# Patient Record
Sex: Female | Born: 1935 | Race: White | Hispanic: No | Marital: Married | Smoking: Former smoker
Health system: Southern US, Community
[De-identification: ages and names within clinical notes are randomized; demographics above are authoritative.]

## PROBLEM LIST (undated history)

## (undated) DIAGNOSIS — E7800 Pure hypercholesterolemia, unspecified: Secondary | ICD-10-CM

## (undated) DIAGNOSIS — K219 Gastro-esophageal reflux disease without esophagitis: Secondary | ICD-10-CM

## (undated) DIAGNOSIS — M1990 Unspecified osteoarthritis, unspecified site: Secondary | ICD-10-CM

## (undated) DIAGNOSIS — R7302 Impaired glucose tolerance (oral): Secondary | ICD-10-CM

## (undated) DIAGNOSIS — E039 Hypothyroidism, unspecified: Secondary | ICD-10-CM

## (undated) DIAGNOSIS — I219 Acute myocardial infarction, unspecified: Secondary | ICD-10-CM

## (undated) DIAGNOSIS — I10 Essential (primary) hypertension: Secondary | ICD-10-CM

## (undated) HISTORY — PX: OTHER SURGICAL HISTORY: SHX169

## (undated) HISTORY — DX: Unspecified osteoarthritis, unspecified site: M19.90

## (undated) HISTORY — DX: Gastro-esophageal reflux disease without esophagitis: K21.9

## (undated) HISTORY — PX: UMBILICAL HERNIA REPAIR: SHX196

## (undated) HISTORY — PX: FOOT SURGERY: SHX648

## (undated) HISTORY — DX: Pure hypercholesterolemia, unspecified: E78.00

## (undated) HISTORY — DX: Essential (primary) hypertension: I10

## (undated) HISTORY — DX: Impaired glucose tolerance (oral): R73.02

## (undated) HISTORY — PX: CHOLECYSTECTOMY: SHX55

---

## 1997-10-06 ENCOUNTER — Ambulatory Visit: Admission: RE | Admit: 1997-10-06 | Discharge: 1997-10-06 | Payer: Self-pay | Admitting: *Deleted

## 1998-11-24 ENCOUNTER — Encounter: Admission: RE | Admit: 1998-11-24 | Discharge: 1998-11-24 | Payer: Self-pay | Admitting: Internal Medicine

## 1998-11-24 ENCOUNTER — Other Ambulatory Visit: Admission: RE | Admit: 1998-11-24 | Discharge: 1998-11-24 | Payer: Self-pay | Admitting: *Deleted

## 1998-12-08 ENCOUNTER — Encounter: Admission: RE | Admit: 1998-12-08 | Discharge: 1998-12-08 | Payer: Self-pay | Admitting: Internal Medicine

## 1999-05-06 ENCOUNTER — Encounter: Admission: RE | Admit: 1999-05-06 | Discharge: 1999-05-06 | Payer: Self-pay | Admitting: Internal Medicine

## 1999-05-26 ENCOUNTER — Ambulatory Visit (HOSPITAL_COMMUNITY): Admission: RE | Admit: 1999-05-26 | Discharge: 1999-05-26 | Payer: Self-pay | Admitting: *Deleted

## 1999-06-01 ENCOUNTER — Encounter: Admission: RE | Admit: 1999-06-01 | Discharge: 1999-06-01 | Payer: Self-pay | Admitting: Internal Medicine

## 1999-06-09 ENCOUNTER — Encounter: Payer: Self-pay | Admitting: *Deleted

## 1999-06-09 ENCOUNTER — Ambulatory Visit (HOSPITAL_COMMUNITY): Admission: RE | Admit: 1999-06-09 | Discharge: 1999-06-09 | Payer: Self-pay | Admitting: *Deleted

## 1999-06-28 ENCOUNTER — Encounter: Payer: Self-pay | Admitting: Emergency Medicine

## 1999-06-28 ENCOUNTER — Emergency Department (HOSPITAL_COMMUNITY): Admission: EM | Admit: 1999-06-28 | Discharge: 1999-06-28 | Payer: Self-pay | Admitting: Emergency Medicine

## 1999-06-29 ENCOUNTER — Encounter: Payer: Self-pay | Admitting: Emergency Medicine

## 1999-07-13 ENCOUNTER — Encounter: Admission: RE | Admit: 1999-07-13 | Discharge: 1999-07-13 | Payer: Self-pay | Admitting: Hematology and Oncology

## 1999-08-05 ENCOUNTER — Encounter: Admission: RE | Admit: 1999-08-05 | Discharge: 1999-08-05 | Payer: Self-pay | Admitting: Internal Medicine

## 2000-07-10 ENCOUNTER — Encounter (INDEPENDENT_AMBULATORY_CARE_PROVIDER_SITE_OTHER): Payer: Self-pay | Admitting: *Deleted

## 2000-07-20 ENCOUNTER — Ambulatory Visit (HOSPITAL_COMMUNITY): Admission: RE | Admit: 2000-07-20 | Discharge: 2000-07-20 | Payer: Self-pay | Admitting: Cardiology

## 2000-07-20 ENCOUNTER — Encounter: Payer: Self-pay | Admitting: Cardiology

## 2000-11-26 ENCOUNTER — Emergency Department (HOSPITAL_COMMUNITY): Admission: EM | Admit: 2000-11-26 | Discharge: 2000-11-26 | Payer: Self-pay | Admitting: *Deleted

## 2000-12-18 ENCOUNTER — Encounter: Payer: Self-pay | Admitting: Internal Medicine

## 2000-12-25 ENCOUNTER — Encounter: Admission: RE | Admit: 2000-12-25 | Discharge: 2000-12-25 | Payer: Self-pay | Admitting: Internal Medicine

## 2001-01-01 ENCOUNTER — Ambulatory Visit (HOSPITAL_BASED_OUTPATIENT_CLINIC_OR_DEPARTMENT_OTHER): Admission: RE | Admit: 2001-01-01 | Discharge: 2001-01-01 | Payer: Self-pay | Admitting: Orthopedic Surgery

## 2001-01-01 ENCOUNTER — Encounter (INDEPENDENT_AMBULATORY_CARE_PROVIDER_SITE_OTHER): Payer: Self-pay | Admitting: *Deleted

## 2001-01-25 ENCOUNTER — Encounter: Admission: RE | Admit: 2001-01-25 | Discharge: 2001-01-25 | Payer: Self-pay | Admitting: Internal Medicine

## 2002-04-22 ENCOUNTER — Encounter: Admission: RE | Admit: 2002-04-22 | Discharge: 2002-04-22 | Payer: Self-pay | Admitting: Internal Medicine

## 2002-05-06 ENCOUNTER — Encounter: Admission: RE | Admit: 2002-05-06 | Discharge: 2002-05-06 | Payer: Self-pay | Admitting: Internal Medicine

## 2002-06-02 ENCOUNTER — Encounter: Payer: Self-pay | Admitting: Cardiology

## 2002-06-02 ENCOUNTER — Ambulatory Visit (HOSPITAL_COMMUNITY): Admission: RE | Admit: 2002-06-02 | Discharge: 2002-06-02 | Payer: Self-pay | Admitting: Cardiology

## 2003-07-17 ENCOUNTER — Ambulatory Visit (HOSPITAL_COMMUNITY): Admission: RE | Admit: 2003-07-17 | Discharge: 2003-07-17 | Payer: Self-pay | Admitting: Cardiology

## 2005-02-07 ENCOUNTER — Encounter: Admission: RE | Admit: 2005-02-07 | Discharge: 2005-02-07 | Payer: Self-pay | Admitting: Cardiology

## 2005-02-07 ENCOUNTER — Encounter (INDEPENDENT_AMBULATORY_CARE_PROVIDER_SITE_OTHER): Payer: Self-pay | Admitting: *Deleted

## 2005-03-13 ENCOUNTER — Encounter: Admission: RE | Admit: 2005-03-13 | Discharge: 2005-03-13 | Payer: Self-pay | Admitting: General Surgery

## 2005-03-16 ENCOUNTER — Ambulatory Visit (HOSPITAL_COMMUNITY): Admission: RE | Admit: 2005-03-16 | Discharge: 2005-03-16 | Payer: Self-pay | Admitting: General Surgery

## 2005-03-16 ENCOUNTER — Encounter (INDEPENDENT_AMBULATORY_CARE_PROVIDER_SITE_OTHER): Payer: Self-pay | Admitting: Specialist

## 2005-03-16 ENCOUNTER — Ambulatory Visit (HOSPITAL_BASED_OUTPATIENT_CLINIC_OR_DEPARTMENT_OTHER): Admission: RE | Admit: 2005-03-16 | Discharge: 2005-03-16 | Payer: Self-pay | Admitting: General Surgery

## 2005-03-30 ENCOUNTER — Encounter: Payer: Self-pay | Admitting: Internal Medicine

## 2006-03-27 ENCOUNTER — Encounter: Admission: RE | Admit: 2006-03-27 | Discharge: 2006-03-27 | Payer: Self-pay | Admitting: Cardiology

## 2006-11-15 ENCOUNTER — Encounter (INDEPENDENT_AMBULATORY_CARE_PROVIDER_SITE_OTHER): Payer: Self-pay | Admitting: *Deleted

## 2006-11-16 DIAGNOSIS — R1032 Left lower quadrant pain: Secondary | ICD-10-CM | POA: Insufficient documentation

## 2006-11-30 ENCOUNTER — Encounter: Payer: Self-pay | Admitting: Internal Medicine

## 2006-11-30 ENCOUNTER — Ambulatory Visit: Payer: Self-pay | Admitting: Internal Medicine

## 2006-11-30 ENCOUNTER — Encounter (INDEPENDENT_AMBULATORY_CARE_PROVIDER_SITE_OTHER): Payer: Self-pay | Admitting: *Deleted

## 2006-11-30 LAB — CONVERTED CEMR LAB
BUN: 6 mg/dL (ref 6–23)
CO2: 29 meq/L (ref 19–32)
Calcium: 9.4 mg/dL (ref 8.4–10.5)
Chloride: 103 meq/L (ref 96–112)
Creatinine, Ser: 0.94 mg/dL (ref 0.40–1.20)
HCT: 33.6 % — ABNORMAL LOW (ref 36.0–46.0)
Hemoglobin: 11.3 g/dL — ABNORMAL LOW (ref 12.0–15.0)
Lipase: 22 units/L (ref 11–59)
MCV: 91.2 fL (ref 78.0–100.0)
Nitrite: NEGATIVE
RDW: 14.6 % — ABNORMAL HIGH (ref 11.5–14.0)
Specific Gravity, Urine: 1.013 (ref 1.005–1.03)
Total Bilirubin: 0.6 mg/dL (ref 0.3–1.2)
Urobilinogen, UA: 0.2 (ref 0.0–1.0)
WBC: 6.5 10*3/uL (ref 4.0–10.5)
pH: 7 (ref 5.0–8.0)

## 2006-12-13 ENCOUNTER — Ambulatory Visit: Payer: Self-pay | Admitting: Internal Medicine

## 2006-12-13 ENCOUNTER — Encounter (INDEPENDENT_AMBULATORY_CARE_PROVIDER_SITE_OTHER): Payer: Self-pay | Admitting: Internal Medicine

## 2006-12-13 ENCOUNTER — Other Ambulatory Visit: Admission: RE | Admit: 2006-12-13 | Discharge: 2006-12-13 | Payer: Self-pay | Admitting: Internal Medicine

## 2006-12-13 LAB — CONVERTED CEMR LAB: Candida species: NEGATIVE

## 2007-01-14 ENCOUNTER — Ambulatory Visit: Payer: Self-pay | Admitting: *Deleted

## 2007-01-14 ENCOUNTER — Encounter (INDEPENDENT_AMBULATORY_CARE_PROVIDER_SITE_OTHER): Payer: Self-pay | Admitting: *Deleted

## 2007-01-14 ENCOUNTER — Ambulatory Visit (HOSPITAL_COMMUNITY): Admission: RE | Admit: 2007-01-14 | Discharge: 2007-01-14 | Payer: Self-pay | Admitting: *Deleted

## 2007-01-14 DIAGNOSIS — M8530 Osteitis condensans, unspecified site: Secondary | ICD-10-CM | POA: Insufficient documentation

## 2007-01-14 DIAGNOSIS — E785 Hyperlipidemia, unspecified: Secondary | ICD-10-CM

## 2007-01-14 DIAGNOSIS — N952 Postmenopausal atrophic vaginitis: Secondary | ICD-10-CM

## 2007-01-14 DIAGNOSIS — I2510 Atherosclerotic heart disease of native coronary artery without angina pectoris: Secondary | ICD-10-CM

## 2007-01-14 DIAGNOSIS — I10 Essential (primary) hypertension: Secondary | ICD-10-CM | POA: Insufficient documentation

## 2007-01-14 DIAGNOSIS — H903 Sensorineural hearing loss, bilateral: Secondary | ICD-10-CM | POA: Insufficient documentation

## 2007-01-16 ENCOUNTER — Encounter (INDEPENDENT_AMBULATORY_CARE_PROVIDER_SITE_OTHER): Payer: Self-pay | Admitting: *Deleted

## 2007-01-16 LAB — CONVERTED CEMR LAB
Cholesterol: 161 mg/dL (ref 0–200)
HDL: 46 mg/dL (ref >39)
Total CHOL/HDL Ratio: 3.5
Triglycerides: 148 mg/dL (ref <150)
VLDL: 30 mg/dL (ref 0–40)

## 2007-01-22 ENCOUNTER — Encounter: Admission: RE | Admit: 2007-01-22 | Discharge: 2007-01-22 | Payer: Self-pay | Admitting: Sports Medicine

## 2007-02-19 ENCOUNTER — Ambulatory Visit: Payer: Self-pay | Admitting: Internal Medicine

## 2007-02-19 DIAGNOSIS — L089 Local infection of the skin and subcutaneous tissue, unspecified: Secondary | ICD-10-CM | POA: Insufficient documentation

## 2007-03-07 ENCOUNTER — Telehealth: Payer: Self-pay

## 2007-04-17 ENCOUNTER — Ambulatory Visit: Payer: Self-pay | Admitting: Infectious Diseases

## 2007-04-17 ENCOUNTER — Encounter (INDEPENDENT_AMBULATORY_CARE_PROVIDER_SITE_OTHER): Payer: Self-pay | Admitting: *Deleted

## 2007-04-17 DIAGNOSIS — R61 Generalized hyperhidrosis: Secondary | ICD-10-CM | POA: Insufficient documentation

## 2007-04-18 LAB — CONVERTED CEMR LAB
BUN: 15 mg/dL (ref 6–23)
Calcium: 9.5 mg/dL (ref 8.4–10.5)
Chloride: 99 meq/L (ref 96–112)
Creatinine, Ser: 0.99 mg/dL (ref 0.40–1.20)

## 2007-04-24 ENCOUNTER — Encounter (INDEPENDENT_AMBULATORY_CARE_PROVIDER_SITE_OTHER): Payer: Self-pay | Admitting: *Deleted

## 2007-10-26 IMAGING — CR DG HIP W/ PELVIS BILAT
5 series · 5 of 5 positions shown · non-contrast
Comparison: None.

CLINICAL DATA: BILATERAL HIPS WITH PELVIS - 5 VIEWS:

[t pelvis a.p.]
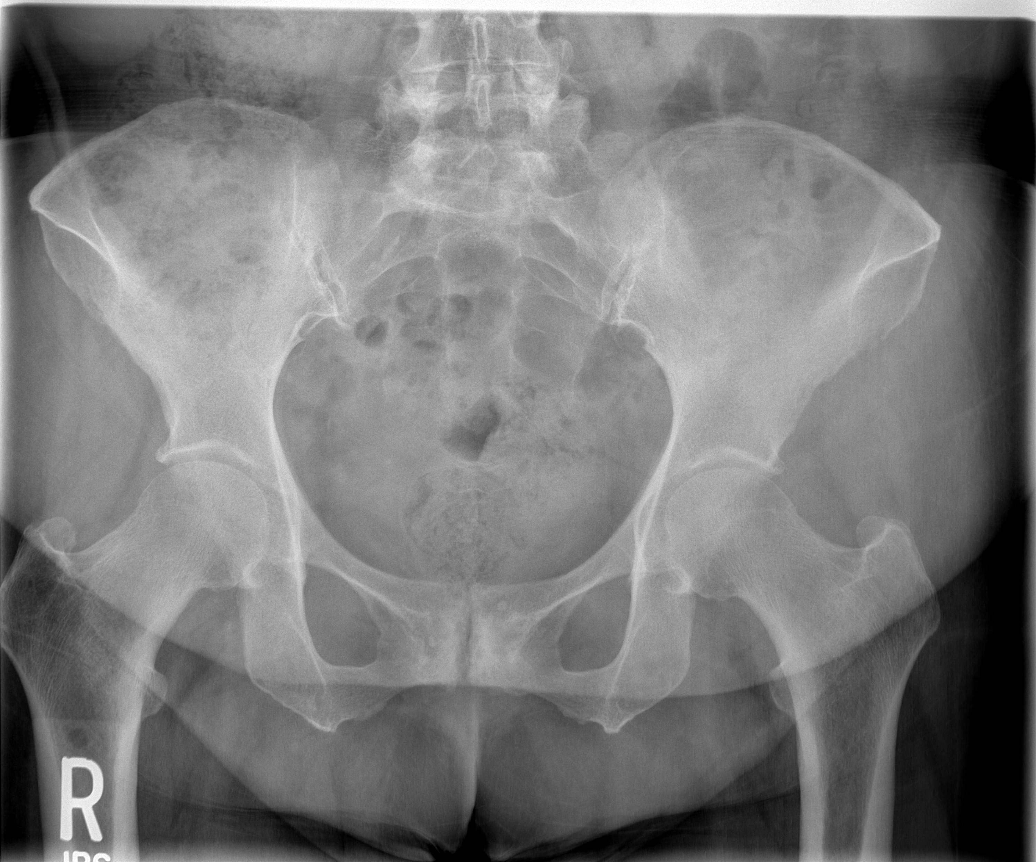

[t hip ap left]
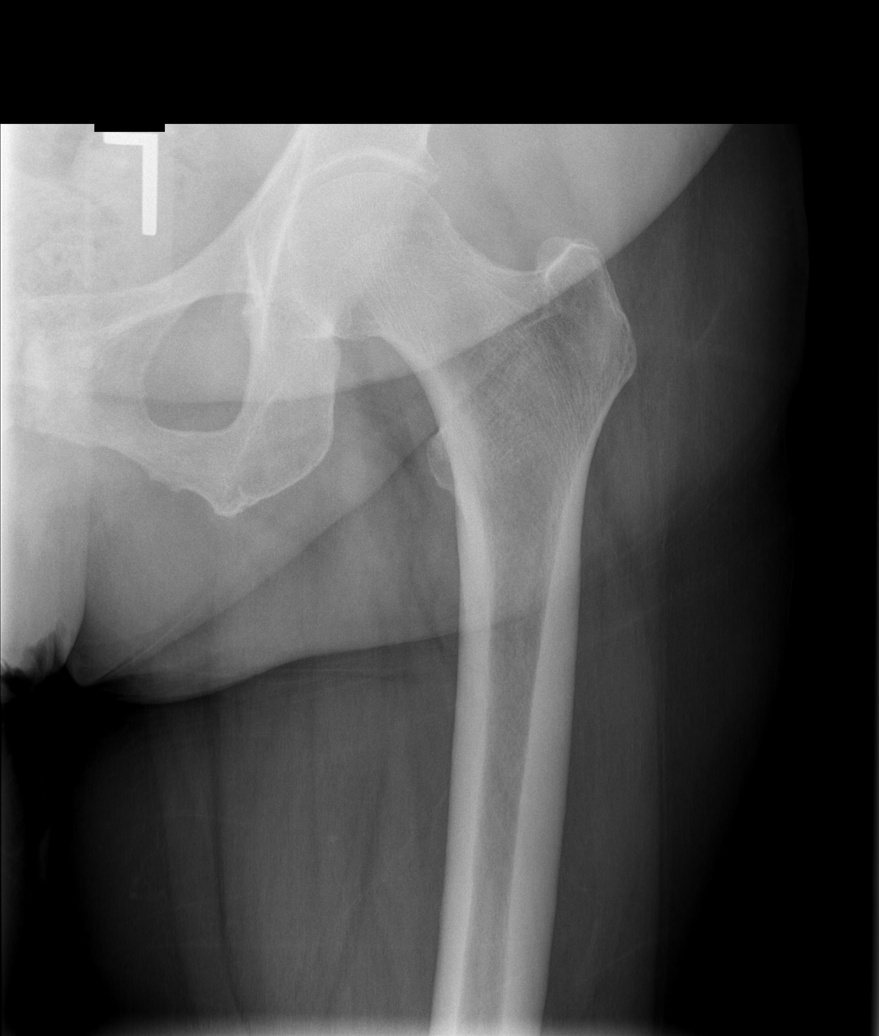

[t hip frog leg left]
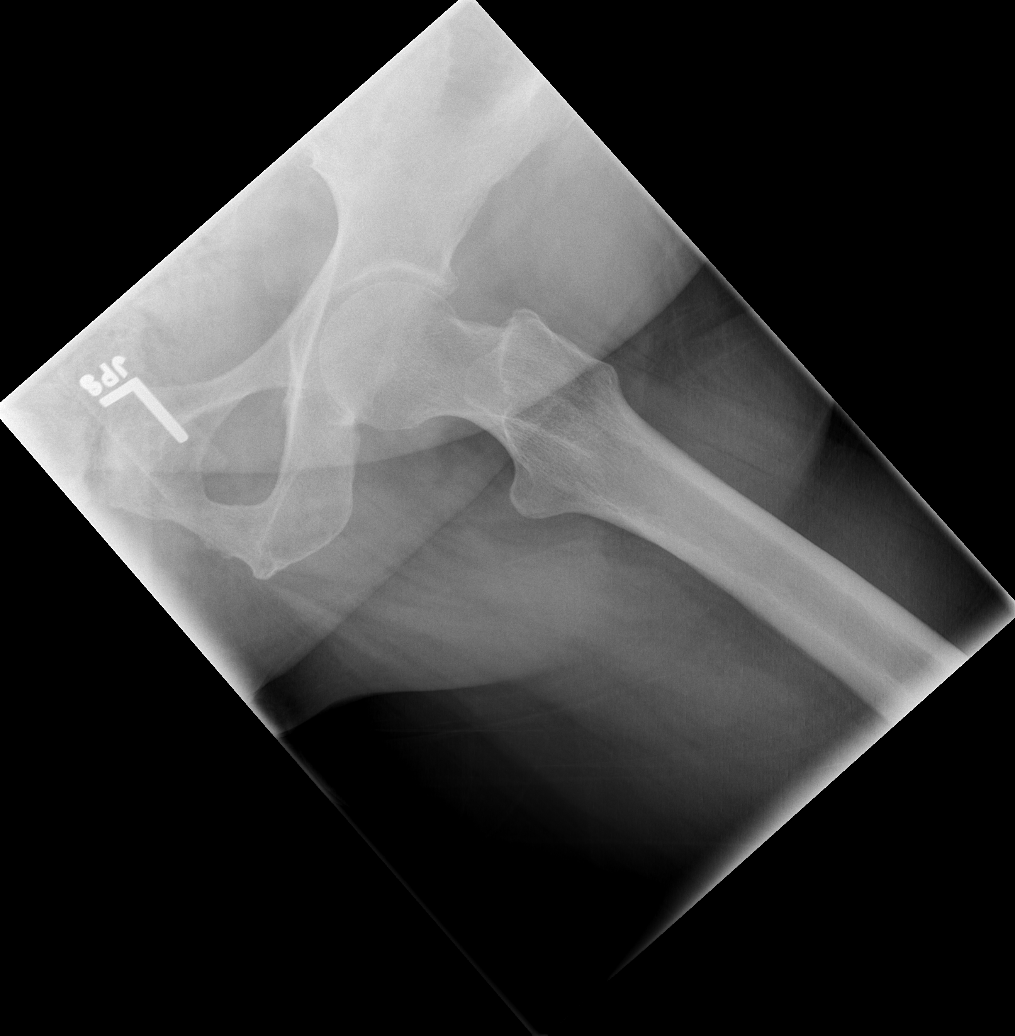

[t hip ap right]
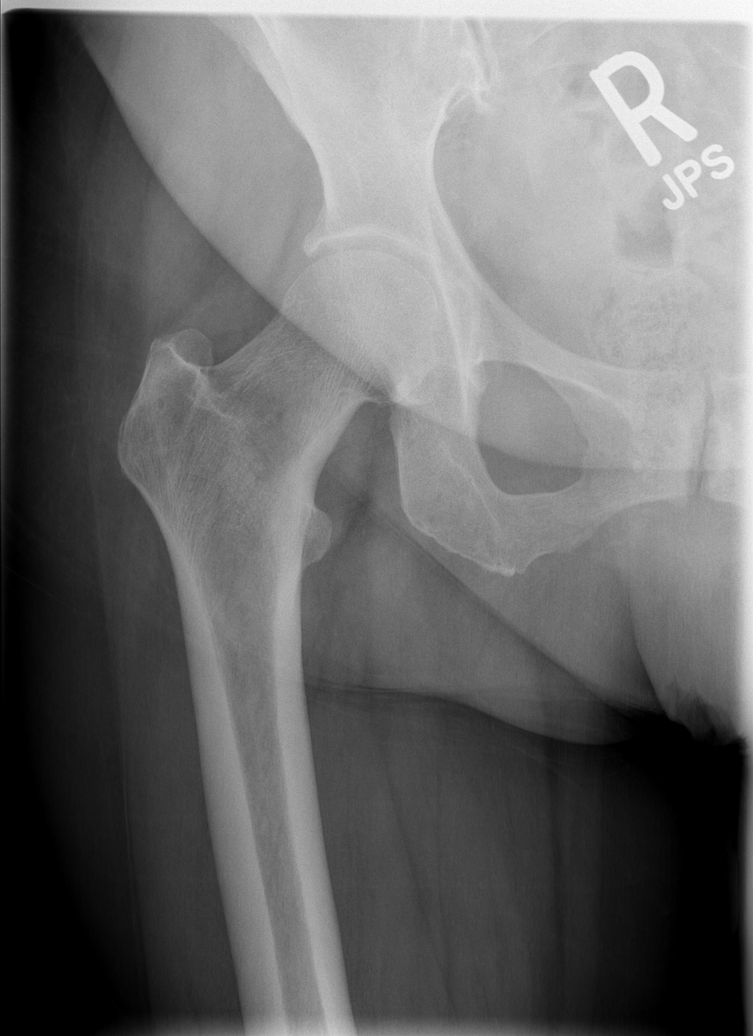

[t hip frog leg right]
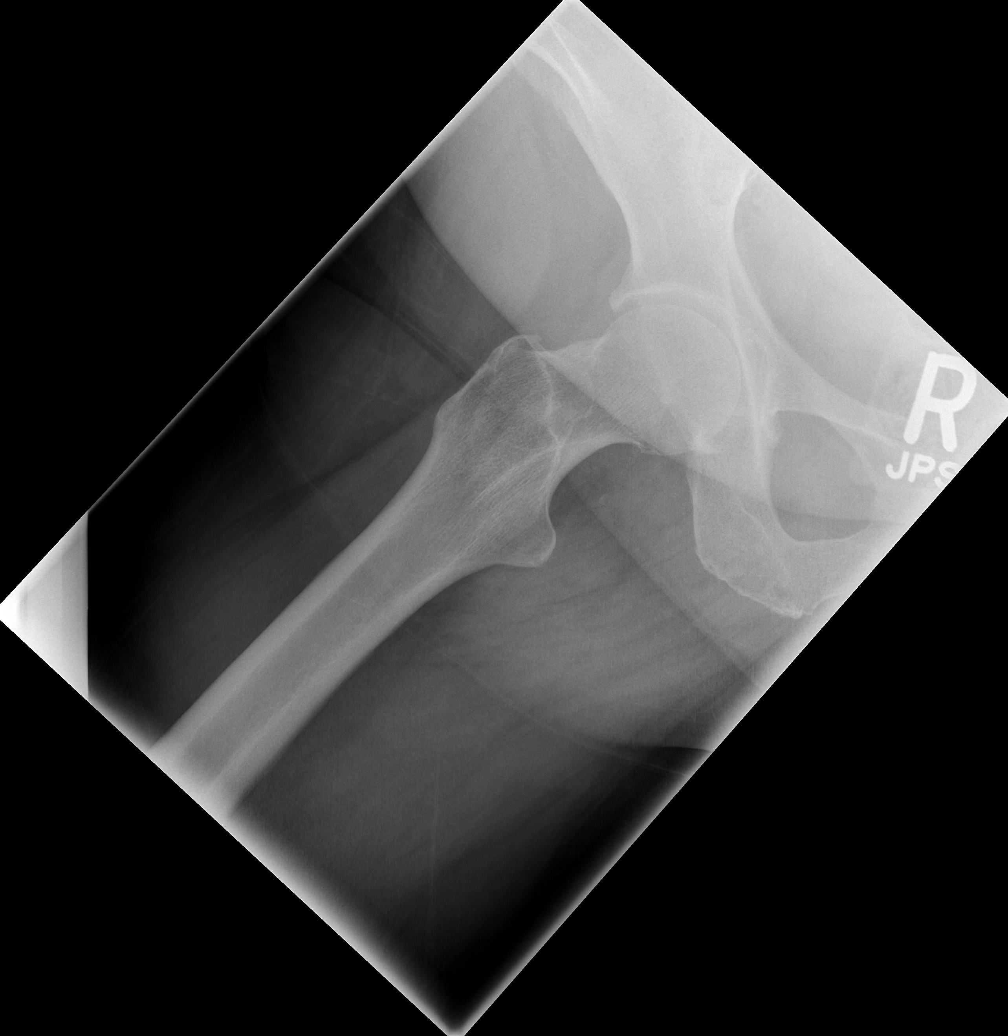

[5 of 5 positions shown; findings below may reference images not displayed]

FINDINGS: Both hips are located.  There is minimal subchondral sclerosis of the acetabula bilaterally.  No significant joint space narrowing identified in either hip.  There are sclerotic changes about the pubic symphysis bilaterally.   The sacroiliac joints are unremarkable.
IMPRESSION: 1.  Findings consistent with osteitis pubis, likely accounting for the patient?s medial bilateral groin pain.  
2.  No significant degenerative changes of the hips for the patient?s age.

## 2008-10-14 DIAGNOSIS — N644 Mastodynia: Secondary | ICD-10-CM

## 2009-02-09 ENCOUNTER — Encounter: Payer: Self-pay | Admitting: Family Medicine

## 2009-02-09 ENCOUNTER — Ambulatory Visit: Payer: Self-pay | Admitting: Family Medicine

## 2009-02-10 DIAGNOSIS — D649 Anemia, unspecified: Secondary | ICD-10-CM | POA: Insufficient documentation

## 2009-02-10 LAB — CONVERTED CEMR LAB
ALT: 12 units/L (ref 0–35)
AST: 15 units/L (ref 0–37)
Basophils Relative: 1 % (ref 0–1)
Chloride: 103 meq/L (ref 96–112)
Creatinine, Ser: 1 mg/dL (ref 0.40–1.20)
Eosinophils Absolute: 0.2 10*3/uL (ref 0.0–0.7)
Lymphs Abs: 1.8 10*3/uL (ref 0.7–4.0)
MCV: 88.9 fL (ref 78.0–100.0)
Neutrophils Relative %: 60 % (ref 43–77)
Platelets: 273 10*3/uL (ref 150–400)
Total Bilirubin: 0.5 mg/dL (ref 0.3–1.2)
WBC: 5.8 10*3/uL (ref 4.0–10.5)

## 2009-02-13 ENCOUNTER — Inpatient Hospital Stay (HOSPITAL_COMMUNITY): Admission: EM | Admit: 2009-02-13 | Discharge: 2009-02-17 | Payer: Self-pay | Admitting: Emergency Medicine

## 2009-03-08 ENCOUNTER — Ambulatory Visit: Payer: Self-pay | Admitting: Family Medicine

## 2009-06-02 ENCOUNTER — Encounter: Payer: Self-pay | Admitting: Family Medicine

## 2009-07-23 ENCOUNTER — Ambulatory Visit: Payer: Self-pay | Admitting: Family Medicine

## 2009-07-23 ENCOUNTER — Encounter: Payer: Self-pay | Admitting: Family Medicine

## 2009-07-28 LAB — CONVERTED CEMR LAB
ALT: 13 units/L (ref 0–35)
AST: 17 units/L (ref 0–37)
Cholesterol: 241 mg/dL — ABNORMAL HIGH (ref 0–200)
Creatinine, Ser: 0.92 mg/dL (ref 0.40–1.20)
HCT: 34.8 % — ABNORMAL LOW (ref 36.0–46.0)
MCV: 90.2 fL (ref 78.0–100.0)
Platelets: 296 10*3/uL (ref 150–400)
RDW: 14.2 % (ref 11.5–15.5)
Total Bilirubin: 0.4 mg/dL (ref 0.3–1.2)
Total CHOL/HDL Ratio: 4.2
VLDL: 30 mg/dL (ref 0–40)

## 2009-08-10 ENCOUNTER — Encounter: Payer: Self-pay | Admitting: *Deleted

## 2009-09-05 ENCOUNTER — Encounter: Payer: Self-pay | Admitting: Family Medicine

## 2010-03-31 ENCOUNTER — Encounter (HOSPITAL_COMMUNITY)
Admission: RE | Admit: 2010-03-31 | Discharge: 2010-04-15 | Payer: Self-pay | Source: Home / Self Care | Attending: Cardiology | Admitting: Cardiology

## 2010-07-21 NOTE — Letter (Signed)
Summary: Generic Letter  [facility]  [address]   [place], [place] [address]   Phone: [phone]  Fax: [phone]    09/05/2009  WRIGLEY WINBORNE [address] [place], [place]  [address]  Dear Ms. Wendell,   You missed your last appointment and do not have another one scheduled.  I am concerned about your health and would like to see you.  Please call the office to schedule an appointment at your earliest convenience.  I hope to see you soon.   Sincerely,   Delbert Harness MD  Appended Document: Generic Letter mailed.

## 2010-07-21 NOTE — Assessment & Plan Note (Signed)
Summary: f/u,df   Vital Signs:  Patient profile:   77 year old female Height:      63.75 inches Weight:      197 pounds BMI:     34.20 Temp:     97.6 degrees F oral Pulse rate:   68 / minute BP sitting:   184 / 70  (left arm) Cuff size:   large  Vitals Entered By: Tessie Fass CMA (July 23, 2009 8:46 AM) CC: F/U Is Patient Diabetic? No Pain Assessment Patient in pain? no        Primary Care Provider:  Delbert Harness MD  CC:  F/U.  History of Present Illness: 77 yo here for follow-up  Memory/Dementia: at last visit spole extensively about memory.  Not a concern today.  Daughter is with her and feels that she is at baseline, does well taking her medications, and note sno possibly dangerous behaviors or confusion.  HTN: See's Dr. Sharyn Lull every 3 months.  Brings script for labs today.  States at last visit he changed some medications but she did not bring her medications today and does not know what those changes were.  Denies CP, SOB, Edema.    We have called her pharmacy and the only medications she has picked up recently have been Simvastatin 40 mg, ramipril 10 mg, Amlodipine 5mg , Protonix 40 mg.  Preventive Health Care:  Will get flu shot today.  Discussed advanced directives.  Desires full code.    Habits & Providers  Alcohol-Tobacco-Diet     Tobacco Status: quit  Allergies: 1)  Prilosec  Past History:  Past Medical History: L-sided Infected ovary? 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 Daughter usually present for her apptmts. Quit smoking  before 2000- 1/2 PPD for > 40 years No ETOH, No illicit drugs. Full Code- given advance directives to go over with family.Smoking Status:  quit  Review of Systems      See HPI General:  Denies fever and weakness. Eyes:  Denies blurring. ENT:  Complains of decreased hearing. CV:  Complains of difficulty breathing while lying down; denies chest pain or discomfort, fatigue, lightheadness, palpitations, shortness of breath with exertion, and swelling of feet. Resp:  Denies chest discomfort, cough, and shortness of breath. GI:  Denies abdominal pain and change in bowel habits. Neuro:  Complains of memory loss; denies falling down and poor balance.  Physical Exam  General:  alert, well-developed, and well-nourished.  Hard of hearing.  Does not answer all questiosn appropriately due to difficulty with hearing as well as poor health literacy. Lungs:  normal respiratory effort, normal breath sounds, no crackles, and no wheezes.   Heart:  normal rate, regular rhythm, no murmur, no gallop, and no rub.   Extremities:  no lower extremity edema.   Impression & Recommendations:  Problem # 1:  ? of DEMENTIA (ICD-294.8)  Does  not seem to have declined per daughter and patient.  Will recheck MMSE 1 year from last exam - August.  No evidence of dangerous behavior although I am concerned with her difficulty in relyaing medical information and managing her medications.  Her daughter is aware of my concern and will pay extra attention to assist her.  Also making this difficult are hearing loss (says her batteries are out) and low education level.  Spent > 30 minutes in evaluation and counselign with patient and daughter.  Orders: [facility]- Est  Level 4 (09811)  Problem # 2:  HYPERTENSION (ICD-401.9) Uncontrolled blood pressure.  Unable to get records from Dr. Annitta Jersey  office. Unable to get medications from home due to husband's illiteracy.   Called pharmacy and reconciled meds that way.   Was not taking carvedilol, as previously.  Patient states Dr. Sharyn Lull took her off it but unsure why.  She says she does not think her BP was low.  Will not restart- will await records from Dr. Annitta Jersey office.  Patient states she will come in next week with medications and will address at that time.  The following medications were removed from the medication list:    Furosemide 40 Mg Tabs (Furosemide) .Marland Kitchen... Take one tablet daily.  prescribed by dr. Sharyn Lull Her updated medication list for this problem includes:    Norvasc 5 Mg Tabs (Amlodipine besylate) .Marland Kitchen... Take 1 tablet by mouth once a day    Altace 10 Mg Caps (Ramipril) .Marland Kitchen... Take 1 tablet by mouth once a day    Coreg 3.125 Mg Tabs (Carvedilol) .Marland Kitchen... Take one tablet every 12 hours- prescribed by dr. Sharyn Lull  Orders: Lipid-[facility] [phone]) Comp Met-[facility] [phone]) [facility]- Est  Level 4 ([address]295)  Problem # 3:  HYPERLIPIDEMIA (ICD-272.4) On Statin, recheck today.  Her updated medication list for this problem includes:    Simvastatin 40 Mg Tabs (Simvastatin) ..... One tablet at bedtime  Orders: Lipid-[facility] ([id number]) Comp Met-[facility] ([id number]) [facility]- Est  Level 4 (03474)  Problem # 4:  Preventive Health Care (ICD-V70.0)  Got flu shot.  Mammogram, Pap not indicated, UTD colonoscopy.  Given advanced directives packet.  Orders: [facility]- Est  Level 4 (99214)  Complete Medication List: 1)  Norvasc 5 Mg Tabs (Amlodipine besylate) .... Take 1 tablet by mouth once a day 2)  Protonix 40 Mg Tbec (Pantoprazole sodium) .... Take 1 tablet by mouth once a day 3)  Altace 10 Mg Caps (Ramipril) .... Take 1 tablet by mouth once a day 4)  Simvastatin 40 Mg Tabs (Simvastatin) .... One tablet at bedtime 5)  Aspirin 81 Mg Tbec (Aspirin) 6)  Coreg 3.125 Mg Tabs (Carvedilol) .... Take one tablet every 12 hours- prescribed by dr.  Sharyn Lull 7)  Nitrostat 0.4 Mg Subl (Nitroglycerin) .... Use as directed.  prescribed by dr. Sharyn Lull  Other Orders: CBC-[facility] [id number])  Patient Instructions: 1)  Bring medications to every visit. 2)  Check blood pressures and bring log to next appointment. 3)  Take a multivitamin daily. 4)  Review this medication list when you get home.  If it is different than what you have please call the office. 5)  Make follow-up appt for next week.  PAP Next Due:  Not Indicated Mammogram Next Due:  Not Indicated    Prevention & Chronic Care Immunizations   Influenza vaccine: refuses  (04/17/2007)   Influenza vaccine due: 04/16/2008    Tetanus booster: Not documented   Tetanus booster due: Not Indicated    Pneumococcal vaccine: given  (  02/17/2009)   Pneumococcal vaccine due: Not Indicated    H. zoster vaccine: Not documented  Colorectal Screening   Hemoccult: Not documented    Colonoscopy: polyps  (04/24/2007)   Colonoscopy due: 04/23/2012  Other Screening   Pap smear: Not documented   Pap smear due: Not Indicated    Mammogram: Not documented   Mammogram due: Not Indicated    DXA bone density scan: Not documented   Smoking status: quit  (07/23/2009)  Lipids   Total Cholesterol: 161  (01/14/2007)   LDL: 85  (01/14/2007)   LDL Direct: Not documented   HDL: 46  (01/14/2007)   Triglycerides: 148  (01/14/2007)    SGOT (AST): 15  (02/09/2009)   SGPT (ALT): 12  (02/09/2009) CMP ordered    Alkaline phosphatase: 51  (02/09/2009)   Total bilirubin: 0.5  (02/09/2009)    Lipid flowsheet reviewed?: Yes   Progress toward LDL goal: Unchanged  Hypertension   Last Blood Pressure: 184 / 70  (07/23/2009)   Serum creatinine: 1.00  (02/09/2009)   Serum potassium 3.8  (02/09/2009) CMP ordered     Hypertension flowsheet reviewed?: Yes   Progress toward BP goal: Deteriorated  Self-Management Support :   Personal Goals (by the next clinic visit) :      Personal blood pressure goal:  130/80  (07/23/2009)     Personal LDL goal: 70  (07/23/2009)    Patient will work on the following items until the next clinic visit to reach self-care goals:     Medications and monitoring: take my medicines every day, check my blood pressure, bring all of my medications to every visit  (07/23/2009)     Eating: drink diet soda or water instead of juice or soda, eat foods that are low in salt  (07/23/2009)    Hypertension self-management support: BP self-monitoring log, Written self-care plan  (07/23/2009)   Hypertension self-care plan printed.    Lipid self-management support: Not documented    Nursing Instructions: Give Flu vaccine today     Appended Document: f/u,df     Allergies: 1)  Prilosec   Complete Medication List: 1)  Norvasc 5 Mg Tabs (Amlodipine besylate) .... Take 1 tablet by mouth once a day 2)  Protonix 40 Mg Tbec (Pantoprazole sodium) .... Take 1 tablet by mouth once a day 3)  Altace 10 Mg Caps (Ramipril) .... Take 1 tablet by mouth once a day 4)  Simvastatin 40 Mg Tabs (Simvastatin) .... One tablet at bedtime 5)  Aspirin 81 Mg Tbec (Aspirin) 6)  Coreg 3.125 Mg Tabs (Carvedilol) .... Take one tablet every 12 hours- prescribed by dr. Sharyn Lull 7)  Nitrostat 0.4 Mg Subl (Nitroglycerin) .... Use as directed.  prescribed by dr. Sharyn Lull  Other Orders: Influenza Vaccine MCR [phone])    Immunizations Administered:  Influenza Vaccine # 1:    Vaccine Type: Fluvax MCR    Site: left deltoid    Mfr: GlaxoSmithKline    Dose: 0.5 ml    Route: IM    Given by: Tessie Fass CMA    Exp. Date: 12/16/2009    Lot #: [id number]    VIS given: 01/10/07 version given July 23, 2009.  Flu Vaccine Consent Questions:    Do you have a history of severe allergic reactions to this vaccine? no    Any prior history of allergic reactions to egg and/or gelatin? no    Do you have a sensitivity to the preservative Thimersol? no    Do you have  a past history of Guillan-Barre  Syndrome? no    Do you currently have an acute febrile illness? no    Have you ever had a severe reaction to latex? no    Vaccine information given and explained to patient? yes    Are you currently pregnant? no

## 2010-07-21 NOTE — Consult Note (Signed)
Summary: [facility]  [facility]   Imported By: Clydell Hakim 07/30/2009 10:52:13  _____________________________________________________________________  External Attachment:    Type:   Image     Comment:   External Document

## 2010-07-21 NOTE — Miscellaneous (Signed)
Summary: no show:  F/U VISIT/[facility]   Allergies: 1)  Prilosec   Complete Medication List: 1)  Norvasc 5 Mg Tabs (Amlodipine besylate) .... Take 1 tablet by mouth once a day 2)  Protonix 40 Mg Tbec (Pantoprazole sodium) .... Take 1 tablet by mouth once a day 3)  Altace 10 Mg Caps (Ramipril) .... Take 1 tablet by mouth once a day 4)  Simvastatin 40 Mg Tabs (Simvastatin) .... One tablet at bedtime 5)  Aspirin 81 Mg Tbec (Aspirin) 6)  Coreg 3.125 Mg Tabs (Carvedilol) .... Take one tablet every 12 hours- prescribed by dr. Sharyn Lull 7)  Nitrostat 0.4 Mg Subl (Nitroglycerin) .... Use as directed.  prescribed by dr. Sharyn Lull

## 2010-09-24 LAB — CBC
HCT: 29.9 % — ABNORMAL LOW (ref 36.0–46.0)
HCT: 30.6 % — ABNORMAL LOW (ref 36.0–46.0)
Hemoglobin: 10.4 g/dL — ABNORMAL LOW (ref 12.0–15.0)
Hemoglobin: 10.9 g/dL — ABNORMAL LOW (ref 12.0–15.0)
Hemoglobin: 11.2 g/dL — ABNORMAL LOW (ref 12.0–15.0)
MCHC: 34.2 g/dL (ref 30.0–36.0)
MCV: 93.1 fL (ref 78.0–100.0)
MCV: 93.4 fL (ref 78.0–100.0)
MCV: 94.4 fL (ref 78.0–100.0)
Platelets: 191 10*3/uL (ref 150–400)
Platelets: 202 10*3/uL (ref 150–400)
Platelets: 214 10*3/uL (ref 150–400)
RBC: 3.38 MIL/uL — ABNORMAL LOW (ref 3.87–5.11)
RBC: 3.39 MIL/uL — ABNORMAL LOW (ref 3.87–5.11)
RDW: 14.2 % (ref 11.5–15.5)
RDW: 14.3 % (ref 11.5–15.5)
RDW: 14.3 % (ref 11.5–15.5)
WBC: 5.2 10*3/uL (ref 4.0–10.5)
WBC: 5.3 10*3/uL (ref 4.0–10.5)

## 2010-09-24 LAB — BASIC METABOLIC PANEL
BUN: 12 mg/dL (ref 6–23)
BUN: 14 mg/dL (ref 6–23)
Calcium: 8.7 mg/dL (ref 8.4–10.5)
Calcium: 8.8 mg/dL (ref 8.4–10.5)
Creatinine, Ser: 1.02 mg/dL (ref 0.4–1.2)
Creatinine, Ser: 1.03 mg/dL (ref 0.4–1.2)
GFR calc Af Amer: >60 mL/min (ref >60)
GFR calc non Af Amer: 51 mL/min — ABNORMAL LOW (ref >60)
GFR calc non Af Amer: 52 mL/min — ABNORMAL LOW (ref >60)
GFR calc non Af Amer: 53 mL/min — ABNORMAL LOW (ref >60)
Glucose, Bld: 110 mg/dL — ABNORMAL HIGH (ref 70–99)
Glucose, Bld: 115 mg/dL — ABNORMAL HIGH (ref 70–99)
Potassium: 3.5 mEq/L (ref 3.5–5.1)
Sodium: 136 mEq/L (ref 135–145)
Sodium: 142 mEq/L (ref 135–145)

## 2010-09-24 LAB — COMPREHENSIVE METABOLIC PANEL
ALT: 15 U/L (ref 0–35)
AST: 24 U/L (ref 0–37)
CO2: 26 mEq/L (ref 19–32)
Chloride: 103 mEq/L (ref 96–112)
GFR calc Af Amer: >60 mL/min (ref >60)
GFR calc non Af Amer: 56 mL/min — ABNORMAL LOW (ref >60)
Sodium: 137 mEq/L (ref 135–145)
Total Bilirubin: 0.5 mg/dL (ref 0.3–1.2)

## 2010-09-24 LAB — IRON AND TIBC
Iron: 73 ug/dL (ref 42–135)
UIBC: 202 ug/dL

## 2010-09-24 LAB — TROPONIN I
Troponin I: 0.16 ng/mL — ABNORMAL HIGH (ref 0.00–0.06)
Troponin I: 0.17 ng/mL — ABNORMAL HIGH (ref 0.00–0.06)
Troponin I: 0.17 ng/mL — ABNORMAL HIGH (ref 0.00–0.06)

## 2010-09-24 LAB — PROTIME-INR: INR: 1.2 (ref 0.00–1.49)

## 2010-09-24 LAB — CK TOTAL AND CKMB (NOT AT ARMC)
CK, MB: 2.4 ng/mL (ref 0.3–4.0)
CK, MB: 3.3 ng/mL (ref 0.3–4.0)
CK, MB: 3.4 ng/mL (ref 0.3–4.0)
Relative Index: 1.3 (ref 0.0–2.5)
Relative Index: 1.9 (ref 0.0–2.5)
Total CK: 180 U/L — ABNORMAL HIGH (ref 7–177)

## 2010-09-24 LAB — POCT CARDIAC MARKERS
CKMB, poc: 3 ng/mL (ref 1.0–8.0)
Myoglobin, poc: 90.2 ng/mL (ref 12–200)
Troponin i, poc: <0.05 ng/mL (ref 0.00–0.09)

## 2010-09-24 LAB — HEPARIN ANTI-XA: Heparin LMW: 0.54 IU/mL

## 2010-09-24 LAB — VITAMIN B12: Vitamin B-12: 649 pg/mL (ref 211–911)

## 2010-09-24 LAB — DIFFERENTIAL
Basophils Absolute: 0 10*3/uL (ref 0.0–0.1)
Lymphocytes Relative: 24 % (ref 12–46)
Monocytes Absolute: 0.5 10*3/uL (ref 0.1–1.0)
Neutro Abs: 3.5 10*3/uL (ref 1.7–7.7)
Neutrophils Relative %: 64 % (ref 43–77)

## 2010-09-24 LAB — HEPARIN LEVEL (UNFRACTIONATED)
Heparin Unfractionated: 0.45 IU/mL (ref 0.30–0.70)
Heparin Unfractionated: 0.76 IU/mL — ABNORMAL HIGH (ref 0.30–0.70)

## 2010-09-24 LAB — RETICULOCYTES
RBC.: 3.34 MIL/uL — ABNORMAL LOW (ref 3.87–5.11)
Retic Count, Absolute: 46.8 10*3/uL (ref 19.0–186.0)
Retic Ct Pct: 1.4 % (ref 0.4–3.1)

## 2010-09-24 LAB — CARDIAC PANEL(CRET KIN+CKTOT+MB+TROPI): Troponin I: 0.17 ng/mL — ABNORMAL HIGH (ref 0.00–0.06)

## 2010-11-01 NOTE — Cardiovascular Report (Signed)
NAME:  Alyssa Graves, Alyssa Graves NO.:  [id number]   MEDICAL RECORD NO.:  [id number]          PATIENT TYPE:  INP   LOCATION:  3709                         FACILITY:  [facility]   PHYSICIAN:  Mohan N. Sharyn Lull, M.D. DATE OF BIRTH:  1929/12/20   DATE OF PROCEDURE:  02/16/2009  DATE OF DISCHARGE:                            CARDIAC CATHETERIZATION   PROCEDURE:  Left cardiac catheterization with selective left and right  coronary angiography, left ventriculography via right groin using  Judkins technique.   SURGEON:  Eduardo Osier. Harwani, MD   INDICATIONS FOR PROCEDURE:  Alyssa Graves is a 77 year old black female  with past medical history significant for coronary artery disease,  history of MI in the past, hypertension, hypercholesteremia, and GERD.  She came to the ER complaining of retrosternal chest pain off and on  since this morning associated with shortness of breath.  Denies any  nausea, vomiting, or diaphoresis.  The patient also gives history of  exertional dyspnea with minimal exertion.  The patient applied Nitro-Dur  patch from her husband's medicine with relief of chest pain.  Denies any  palpitation, lightheadedness, or syncope.  Denies PND, orthopnea, or leg  swelling.  Denies cough, fever, or chills.  Denies relation of chest  pain to food, breathing, or movement.  EKG done in the ER showed sinus  bradycardia with ST-T wave changes in the anterolateral leads.  The  patient was noted to also have minimally elevated troponin I with normal  CPK-MBs.  Due to typical anginal chest pain and multiple risk factors, I  discussed with the patient regarding left cath, possible PTCA stenting,  its risks and benefits i.e. death, MI, stroke, need for emergency CABG,  risk of restenosis, local vascular complications, etc. and consented for  the procedure.   PROCEDURE IN DETAIL:  After obtaining informed consent, the patient was  brought to the cath lab and was placed on fluoroscopy  table.  Right  groin was prepped and draped in the usual fashion.  Xylocaine 1% was  used for local anesthesia in the right groin.  With the help of thin-  walled needle, a 6-French arterial sheath was placed.  The sheath was  aspirated and flushed.  Next, a 6-French left Judkins catheter was  advanced over the wire under fluoroscopic guidance up to the ascending  aorta.  Wire was pulled out.  The catheter was aspirated and connected  to the manifold.  Catheter was further advanced and engaged into left  coronary ostium.  Multiple views of the left system were taken.  Next,  the catheter was disengaged and was pulled out over the wire and was  replaced with 6-French right Judkins catheter which was advanced over  the wire under fluoroscopic guidance up to the ascending aorta.  Wire  was pulled out.  The catheter was aspirated and connected to the  manifold.  Catheter was further advanced and engaged into the right  coronary ostium.  Multiple views of the right system were taken.  Next,  the catheter was disengaged and was pulled out over the wire and was  replaced with 6-French  pigtail catheter which was advanced over the wire  under fluoroscopic guidance up to the ascending aorta.  Wire was pulled  out.  The catheter was aspirated and connected to the manifold.  Catheter was further advanced across the aortic valve into the LV.  LV  pressures were recorded.  Next, LV-graphy was done in 30-degree RAO  position.  Post-angiographic pressures were recorded from LV and then  pullback pressures were recorded from the aorta.  There was no gradient  across the aortic valve.  Next, pigtail catheter was pulled out over the  wire.  Sheaths were aspirated and flushed.   FINDINGS:  The LV showed good LV systolic function, LVH, EF of 16-10%,  and left main was patent.  LAD has 10-15% proximal and mid stenosis and  20-25% distal stenosis.  Diagonal-1 has 50-60% ostial stenosis.  The  vessel is very  small.  Ramus has mild disease.  Left circumflex has 10-  15% proximal and distal stenosis.  OM-1 is very  very small.  OM-2 is moderate size which has 15-20% proximal stenosis.  RCA has 20-25% ostial and 10-15% proximal and mid stenosis.  PDA and PLV  branches were small which were patent.  The patient tolerated the  procedure well.  There were no complications.  The patient was  transferred to recovery room in stable condition.      Eduardo Osier. Sharyn Lull, M.D.  Electronically Signed     [facility]MED[facility]  D:  02/17/2009  T:  02/18/2009  Job:  [id number]

## 2010-11-01 NOTE — Discharge Summary (Signed)
NAME:  Alyssa Graves, Alyssa Graves NO.:  [id number]   MEDICAL RECORD NO.:  [id number]          PATIENT TYPE:  INP   LOCATION:  3709                         FACILITY:  [facility]   PHYSICIAN:  Mohan N. Sharyn Lull, M.D. DATE OF BIRTH:  08-07-1929   DATE OF ADMISSION:  02/13/2009  DATE OF DISCHARGE:  02/17/2009                               DISCHARGE SUMMARY   ADMITTING DIAGNOSES:  1. Chest pain, rule out myocardial infarction.  2. Mild congestive heart failure.  3. Hypertension.  4. Elevated blood sugar, rule out diabetes mellitus.  5. Hypercholesteremia.  6. Anemia.   FINAL DIAGNOSES:  1. Status post probable very small non-Q-wave myocardial infarction.  2. Compensated congestive heart failure, secondary to diastolic      dysfunction.  3. Hypertension.  4. Glucose intolerance.  5. Hypercholesteremia.  6. Anemia.  7. Morbid obesity.  8. Gastroesophageal reflux disease.   DISCHARGE HOME MEDICATIONS:  1. Enteric-coated aspirin 81 mg 1 tablet daily.  2. Coreg 3.125 mg 1 tablet every 12 hours.  3. Ramipril 10 mg 1 capsule daily.  4. Norvasc 5 mg tablet daily.  5. Lasix 40 mg 1 tablet daily.  6. Simvastatin 40 mg 1 tablet daily at night.  7. Protonix 40 mg 1 tablet daily.  8. Nitrostat 0.4 mg sublingual use as directed.   DIET:  Low-salt, low cholesterol.  The patient has been advised to avoid  sweets.   Post cardiac cath instructions have been given.   FOLLOWUP:  Follow up with me in 1 week.   CONDITION AT DISCHARGE:  Stable.   BRIEF HISTORY AND HOSPITAL COURSE:  Ms. Tomassetti is a 77 year old black  female with past medical history significant for coronary artery  disease, history of MI in the past, hypertension, hypercholesteremia,  and GERD.  She came to the ER complaining of retrosternal chest pain off  and on since this morning associated with shortness of breath.  Denies  any nausea, vomiting, or diaphoresis.  The patient also gives history of  exertional dyspnea  with minimal exertion.  The patient applied Nitro-Dur  patch from her husband's meds with relief of chest pain.  Denies any  palpitation, lightheadedness, or syncope.  Denies PND, orthopnea, or leg  swelling.  Denies cough, fever, or chills.  Denies relation of chest  pain to food, breathing, or movement.   PAST MEDICAL HISTORY:  As above.   PAST SURGICAL HISTORY:  1. She had cholecystectomy in the past.  2. She had umbilical hernia repair in the past.  3. She had right breast adenomatous duct excision in the past.  4. She had both feet surgery in the past.   ALLERGIES:  No known drug allergies.   MEDICATIONS AT HOME:  She is on:  1. Atenolol 50 mg p.o. daily.  2. Norvasc 5 mg p.o. daily.  3. Ramipril 10 mg 1 capsule daily.  4. Hydrochlorothiazide 25 mg half tablet daily.  5. Simvastatin 40 mg p.o. daily.  6. Protonix 40 mg p.o. daily.   SOCIAL HISTORY:  She is married, has 7 children.  No history of smoking  or alcohol  abuse.  She is retired, worked in the farm in the past.   FAMILY HISTORY:  Noncontributory.   PHYSICAL EXAMINATION:  GENERAL:  She is alert, awake, and oriented x3,  in no acute distress.  VITAL SIGNS:  Blood pressure is 109/61, pulse was 62 regular.  HEENT:  Conjunctivae is pink.  NECK:  Supple.  Positive JVD.  LUNGS:  Decreased breath sounds at bases with occasional rales.  CARDIOVASCULAR:  S1 and S2 is normal.  There was soft systolic murmur  and S3 gallop.  ABDOMEN:  Soft.  Bowel sounds were present, obese and nontender.  EXTREMITIES:  There is no clubbing, cyanosis, or edema.   LABORATORIES:  Hemoglobin was 11.2, hematocrit 33.1, and white count of  5.5.  Her point-of-care CK-MB was 3.0.  Troponin I less than 0.05.  Sodium was 136, potassium 3.5, chloride 102, bicarb 26, and glucose 115.  BUN 15 and creatinine 1.02.  Her BNP was elevated to 26.  Second set  troponin was 0.17, third set troponin I was 0.17, and next set was 0.16.  Her hemoglobin A1c  was 6.0.   BRIEF HOSPITAL COURSE:  The patient was admitted to Telemetry Unit.  The  patient had minimally elevated troponin I with normal CK-MB.  The  patient did not have any further episodes of chest pain.  During the  hospital stay, the patient was started on IV heparin and nitrates, and  also on IV Lasix with good diuresis.  Discussed with the patient  regarding left cath and possible PTCA stenting at length, its risks and  benefits and consented for the procedure.  The patient subsequently  underwent left cardiac cath with selective left and right coronary  angiography.  Yesterday, as per procedure report, the patient tolerated  the procedure well.  There were no complications.  The  patient's groin is stable with no evidence of hematoma or bruit.  The  patient has been ambulating in the hallway without any problems.  The  patient will be discharged home on above medications and will be  followed up in my office in 1 week.      Eduardo Osier. Sharyn Lull, M.D.  Electronically Signed     [facility]MED[facility]  D:  02/17/2009  T:  02/18/2009  Job:  [id number]

## 2010-11-04 NOTE — Op Note (Signed)
NAME:  Alyssa Graves, Alyssa Graves NO.:  [id number]   MEDICAL RECORD NO.:  [id number]          PATIENT TYPE:  AMB   LOCATION:  DSC                          FACILITY:  [facility]   PHYSICIAN:  Leonie Man, M.D.   DATE OF BIRTH:  [birth year]   DATE OF PROCEDURE:  03/16/2005  DATE OF DISCHARGE:                                 OPERATIVE REPORT   PREOPERATIVE DIAGNOSIS:  Duct papillomatosis, right breast.   POSTOPERATIVE DIAGNOSES:  Duct papillomatosis, right breast.   PROCEDURE:  Major duct excision, right breast.   SURGEON:  Ballen.   ASSISTANT:  OR nurse.   ANESTHESIA:  General.   SPECIMENS TO THE PATHOLOGY LAB:  Breast tissue.   ESTIMATED BLOOD LOSS:  Minimal.   COMPLICATIONS:  None apparent. The patient returned to the PACU in excellent  condition.   NOTE:  [name] is a 77 year old Native-American female presenting with  bloody nipple discharge and on ductogram was noted to have multiple  papillomas within the nipple areolar complex. The patient comes to the  operating room now for duct excision of the right breast after risks and  potential benefits of surgery been fully discussed, questions answered and  consent obtained.   PROCEDURE:  Following induction of satisfactory general anesthesia, the  patient is positioned supinely. The right breast was prepped and draped to  be included in the sterile operative field. The patient is positively  identified and site positively identified.   A circumareolar incision is carried down the inferior border of the nipple.  The nipple was raised superiorly at the flap carrying the dissection down to  and inclusive of the major ducts leading up to the nipple. These were then  grasped and dissection was carried down deep into the breast tissues for  approximately 5 cm removing the entire core of breast tissue. This was  removed and forwarded for pathologic evaluation. Hemostasis obtained in the  breast with electrocautery.  Sponge, instrument and sharp counts were  verified after the breast tissues were reapproximated  with 2-0 Vicryl.  Subcutaneous tissues closed with 3-0 Vicryl and skin with  a 5-0 Monocryl suture. The incision is reinforced with Steri-Strips. Sterile  dressings were applied. The anesthetic reversed and the patient removed from  the operating room to the recovery room in stable condition. She tolerated  the procedure well.      Leonie Man, M.D.  Electronically Signed     PB/MEDQ  D:  03/16/2005  T:  03/16/2005  Job:  [id number]   cc:   Eduardo Osier. Sharyn Lull, M.D.  Fax: [phone]

## 2010-11-04 NOTE — Op Note (Signed)
[place]  Patient:    Alyssa Graves, Alyssa Graves                    MRN: [id number] Proc. Date: 11/26/00 Adm. Date:  [id number] Attending:  Carmelina Peal                           Operative Report  PREOPERATIVE DIAGNOSIS:  Cellulitis and infection, proximal phalanx, left index finger.  POSTOPERATIVE DIAGNOSIS:  Cellulitis and infection, proximal phalanx, left index finger.  OPERATION:  Incision and drainage and culture, left index finger proximal phalanx.  SURGEON:  Nicki Reaper, M.D.  ASSISTANT:  Joaquin Courts, R.N.  ANESTHESIA:  Metacarpal block.  HISTORY:  The patient is a 77 year old female with a history of pain, swelling and pointing of a mass on the radial aspect of the left index finger.  It has been gradually getting worse.  DESCRIPTION OF PROCEDURE:  The patient was given a metacarpal block with 2% Xylocaine without epinephrine and prepped and draped using Betadine solution.  A tourniquet placed high on the arm was inflated to 250 mmHg.  A mid-lateral incision was made directly over the mass and carried down through subcutaneous tissue.  Thickened tissue was encountered.  No gross purulence was encountered.  The area was opened, taking care not to involve the flexor sheath.  ______ signs were present prior to the procedure.  The wound was irrigated and packed open.  Cultures were taken.  A sterile compressive dressing were applied.  The patient tolerated the procedure well.  She is discharged home to returned to the [facility] in three days on Tylenol No. 3 and Keflex. DD:  11/26/00 TD:  11/27/00 Job: [id number]

## 2010-11-04 NOTE — Op Note (Signed)
[place]  Patient:    Alyssa Graves, Alyssa Graves                    MRN: [id number] Proc. Date: 01/01/01 Adm. Date:  [id number] Attending:  Ronne Binning                           Operative Report  PREOPERATIVE DIAGNOSIS:  Questionable Sweets disease, left hand.  POSTOPERATIVE DIAGNOSIS:  Questionable Sweets disease, left hand.  PROCEDURE:  Biopsy, left hand.  SURGEON:  Nicki Reaper, M.D.  ASSISTANT:  Joaquin Courts, R.N.  ANESTHESIA:  General.  ANESTHESIOLOGIST:  Halford Decamp, M.D.  HISTORY:  The patient is a 77 year old female with what appeared to be an infection.  She has undergone I&D without significant growth.  She was referred to infectious disease, and they have requested a biopsy to rule out Sweets disease.  DESCRIPTION OF PROCEDURE:  The patient was brought to the operating room, where a general anesthetic was carried out without difficulty.  She was prepped and draped using Betadine scrub and solution, the left arm free.  The limb was exsanguinated from the wrist proximally.  A tourniquet placed high in the arm was inflated to 250 mmHg.  An ellipse of tissue was removed from the dorsal aspect of the margin into the distal aspect of the margin, into the central aspect of the area of induration, primarily over the middle finger metacarpophalangeal joint area.  The specimen was sent to pathology.  The wound was irrigated and closed with interrupted 5-0 nylon suture.  A sterile compressive dressing was applied.  The patient tolerated the procedure well and was taken to the recovery room for observation in satisfactory condition. She is discharged home, to return to the [facility] [place] in one weeks time on [place]. DD:  01/01/01 TD:  01/01/01 Job: [id number]

## 2011-05-21 ENCOUNTER — Other Ambulatory Visit: Payer: Self-pay | Admitting: Cardiology

## 2011-12-05 ENCOUNTER — Other Ambulatory Visit: Payer: Self-pay | Admitting: Cardiovascular Disease

## 2011-12-05 ENCOUNTER — Ambulatory Visit
Admission: RE | Admit: 2011-12-05 | Discharge: 2011-12-05 | Disposition: A | Discharge status: Home / Self Care | Payer: PRIVATE HEALTH INSURANCE | Source: Ambulatory Visit | Attending: Cardiovascular Disease | Admitting: Cardiovascular Disease

## 2011-12-05 DIAGNOSIS — M79605 Pain in left leg: Secondary | ICD-10-CM

## 2013-02-14 ENCOUNTER — Other Ambulatory Visit: Payer: Self-pay | Admitting: Cardiovascular Disease

## 2013-02-14 ENCOUNTER — Ambulatory Visit
Admission: RE | Admit: 2013-02-14 | Discharge: 2013-02-14 | Disposition: A | Discharge status: Home / Self Care | Payer: PRIVATE HEALTH INSURANCE | Source: Ambulatory Visit | Attending: Cardiovascular Disease | Admitting: Cardiovascular Disease

## 2013-02-14 DIAGNOSIS — M25552 Pain in left hip: Secondary | ICD-10-CM

## 2013-03-19 ENCOUNTER — Encounter: Payer: Self-pay | Admitting: Cardiology

## 2013-03-19 ENCOUNTER — Ambulatory Visit (INDEPENDENT_AMBULATORY_CARE_PROVIDER_SITE_OTHER): Payer: PRIVATE HEALTH INSURANCE | Admitting: Cardiology

## 2013-03-19 VITALS — BP 180/75 | HR 62 | Wt 202.0 lb

## 2013-03-19 DIAGNOSIS — I10 Essential (primary) hypertension: Secondary | ICD-10-CM

## 2013-03-19 DIAGNOSIS — I2510 Atherosclerotic heart disease of native coronary artery without angina pectoris: Secondary | ICD-10-CM

## 2013-03-19 DIAGNOSIS — E785 Hyperlipidemia, unspecified: Secondary | ICD-10-CM

## 2013-03-19 MED ORDER — OMEPRAZOLE 20 MG PO CPDR: 20.0000 mg | DELAYED_RELEASE_CAPSULE | Freq: Every day | ORAL | Status: DC

## 2013-03-19 MED ORDER — HYDRALAZINE HCL 25 MG PO TABS: 25.0000 mg | ORAL_TABLET | Freq: Three times a day (TID) | ORAL | Status: DC

## 2013-03-19 NOTE — Patient Instructions (Addendum)
Continue your current therapy  You need to eliminate sodium (salt) from your diet.  We will add hydralazine 25 mg tid for your blood pressure.  We will see you again in 4 weeks with fasting lab work.

## 2013-03-19 NOTE — Progress Notes (Signed)
Alyssa Graves Date of Birth: 30-Jan-1930 Medical Record #[id number]  History of Present Illness: [name] is self-referred for evaluation of hypertension. She is a pleasant 79 year old [place] female. She has a history of hypertension and hypercholesterolemia. She also has a history of glucose intolerance. She has had prior cardiac evaluation in 2010 with cardiac catheterization which demonstrated minor nonobstructive coronary disease and normal LV function. She also a normal Myoview study in 2011. She states her blood pressure has been difficult to control. At time she complains of seeing flashes in her vision. Her daughter does report that she cooks with a lot of salt. She currently denies any dyspnea, chest pain, or palpitations. She has no edema. She has no history of TIA or stroke.  Current Outpatient Prescriptions on File Prior to Visit  Medication Sig Dispense Refill  . amLODipine (NORVASC) 5 MG tablet Take 5 mg by mouth daily.        Marland Kitchen aspirin 81 MG tablet Take 81 mg by mouth as needed.       . nitroGLYCERIN (NITROSTAT) 0.4 MG SL tablet Place 0.4 mg under the tongue every 5 (five) minutes as needed. Prescribed by Dr. Sharyn Lull       . ramipril (ALTACE) 10 MG capsule Take 10 mg by mouth daily.         No current facility-administered medications on file prior to visit.    No Known Allergies  Past Medical History  Diagnosis Date  . HTN (hypertension)   . Hypercholesterolemia   . Glucose intolerance (impaired glucose tolerance)   . GERD (gastroesophageal reflux disease)   . Arthritis     Past Surgical History  Procedure Laterality Date  . Cholecystectomy    . Umbilical hernia repair    . Foot surgery    . Hammer toes      History  Smoking status  . Former Smoker  Smokeless tobacco  . Not on file    History  Alcohol Use No    History reviewed. No pertinent family history.  Review of Systems: The review of systems is positive for chronic hearing loss..   All other systems were reviewed and are negative.  Physical Exam: BP 180/75  Pulse 62  Wt 202 lb (91.627 kg)  BMI 34.96 kg/m2 She is a pleasant female in no acute distress. HEENT: Normocephalic, atraumatic. Pupils equal round and reactive. Sclera are clear. Oropharynx is clear. Neck: No JVD, adenopathy, thyromegaly, or bruits. Lungs: Clear Cardiovascular: Regular rate and rhythm. Normal S1 and S2. Positive S4. No murmur or rub. Abdomen: Soft and nontender. No masses or hepatosplenomegaly. Bowel sounds are positive. Extremities: No cyanosis or edema. Pedal pulses are 2+ and symmetric. Skin: Warm and dry Neuro: Alert and oriented x3. Cranial nerves II through XII are intact.  LABORATORY DATA: ECG today demonstrates normal sinus rhythm with a rate of 61 beats per minute. She has LVH with repolarization abnormality.  Assessment / Plan: 1. Hypertension-uncontrolled. She is currently on amlodipine and Altace. I don't think she would tolerate beta blocker therapy with her low heart rate. I recommended the addition of hydralazine 25 mg 3 times a day. I stressed the importance of sodium restriction in her diet. I will have her return in 4 weeks and we will check fasting lab work at that time including chemistries, lipid panel, and CBC.  2. Hyperlipidemia-will assess on her followup lab work  3. Minor nonobstructive coronary disease. Risk factor modification is indicated. She is asymptomatic.  I have recommended the patient get a primary care provider. I recommended she get a flu shot.

## 2013-04-23 ENCOUNTER — Encounter: Payer: Self-pay | Admitting: Nurse Practitioner

## 2013-04-23 ENCOUNTER — Ambulatory Visit (INDEPENDENT_AMBULATORY_CARE_PROVIDER_SITE_OTHER): Payer: PRIVATE HEALTH INSURANCE | Admitting: Nurse Practitioner

## 2013-04-23 VITALS — BP 150/80 | HR 70 | Ht 60.0 in | Wt 201.8 lb

## 2013-04-23 DIAGNOSIS — E785 Hyperlipidemia, unspecified: Secondary | ICD-10-CM

## 2013-04-23 DIAGNOSIS — I10 Essential (primary) hypertension: Secondary | ICD-10-CM

## 2013-04-23 DIAGNOSIS — I2510 Atherosclerotic heart disease of native coronary artery without angina pectoris: Secondary | ICD-10-CM

## 2013-04-23 LAB — HEMOGLOBIN A1C: Hgb A1c MFr Bld: 6.6 % — ABNORMAL HIGH (ref 4.6–6.5)

## 2013-04-23 LAB — HEPATIC FUNCTION PANEL
ALT: 21 U/L (ref 0–35)
AST: 23 U/L (ref 0–37)
Total Bilirubin: 0.4 mg/dL (ref 0.3–1.2)
Total Protein: 7.7 g/dL (ref 6.0–8.3)

## 2013-04-23 LAB — CBC WITH DIFFERENTIAL/PLATELET
Basophils Absolute: 0 10*3/uL (ref 0.0–0.1)
Lymphocytes Relative: 22.6 % (ref 12.0–46.0)
Monocytes Relative: 6.9 % (ref 3.0–12.0)
Neutrophils Relative %: 68.1 % (ref 43.0–77.0)
Platelets: 280 10*3/uL (ref 150.0–400.0)
RDW: 14.4 % (ref 11.5–14.6)

## 2013-04-23 LAB — BASIC METABOLIC PANEL
BUN: 17 mg/dL (ref 6–23)
CO2: 27 mEq/L (ref 19–32)
Calcium: 9.2 mg/dL (ref 8.4–10.5)
Creatinine, Ser: 1 mg/dL (ref 0.4–1.2)
GFR: 56.46 mL/min — ABNORMAL LOW (ref >60.00)
Sodium: 137 mEq/L (ref 135–145)

## 2013-04-23 LAB — LIPID PANEL
HDL: 49.3 mg/dL (ref >39.00)
Triglycerides: 166 mg/dL — ABNORMAL HIGH (ref 0.0–149.0)

## 2013-04-23 LAB — LDL CHOLESTEROL, DIRECT: Direct LDL: 172.9 mg/dL

## 2013-04-23 NOTE — Progress Notes (Signed)
Alyssa Graves Date of Birth: 09/09/1929 Medical Record #[id number]  History of Present Illness: [name] is seen back today for a one month check. Seen for Dr. [place]. She has HTN, HLD and glucose intolerance. Past cardiac evaluation in 2010 with cardiac cath showing minor nonobstructive CAD and normal EF. Myoview from 2011 whas normal. No history of stroke or TIA.  Seen a month ago - BP not controlled - using too much salt. Hydralazine was added. Not felt to be a candidate for beta blocker given her low heart rate.   Comes back today. Here with her daughter. Feels pretty good. No chest pain. Not short of breath. Still with some visual disturbances at times - but improved with last change in her medicines. Trying to use less salt. Has not had any of her medicines today since she is fasting for her labs. Daughter still trying to work on getting her in with primary care - but no luck yet.    Current Outpatient Prescriptions  Medication Sig Dispense Refill  . amLODipine (NORVASC) 5 MG tablet Take 5 mg by mouth daily.        Marland Kitchen aspirin 81 MG tablet Take 81 mg by mouth as needed.       . hydrALAZINE (APRESOLINE) 25 MG tablet Take 1 tablet (25 mg total) by mouth 3 (three) times daily.  270 tablet  3  . hydrochlorothiazide (MICROZIDE) 12.5 MG capsule Take 12.5 mg by mouth daily.      . nitroGLYCERIN (NITROSTAT) 0.4 MG SL tablet Place 0.4 mg under the tongue every 5 (five) minutes as needed. Prescribed by Dr. Sharyn Lull       . omeprazole (PRILOSEC) 20 MG capsule Take 1 capsule (20 mg total) by mouth daily.  30 capsule  6  . ramipril (ALTACE) 10 MG capsule Take 10 mg by mouth daily.        . rosuvastatin (CRESTOR) 20 MG tablet Take 20 mg by mouth daily.       No current facility-administered medications for this visit.    No Known Allergies  Past Medical History  Diagnosis Date  . HTN (hypertension)   . Hypercholesterolemia   . Glucose intolerance (impaired glucose tolerance)   . GERD  (gastroesophageal reflux disease)   . Arthritis     Past Surgical History  Procedure Laterality Date  . Cholecystectomy    . Umbilical hernia repair    . Foot surgery    . Hammer toes      History  Smoking status  . Former Smoker  Smokeless tobacco  . Not on file    History  Alcohol Use No    History reviewed. No pertinent family history.  Review of Systems: The review of systems is per the HPI.  All other systems were reviewed and are negative.  Physical Exam: BP 150/80  Pulse 70  Ht 5' (1.524 m)  Wt 201 lb 12.8 oz (91.536 kg)  BMI 39.41 kg/m2  SpO2 91% BP by me is 140/60. Patient is very pleasant and in no acute distress. She is obese. Skin is warm and dry. Color is normal.  HEENT is unremarkable. Normocephalic/atraumatic. PERRL. Sclera are nonicteric. Neck is supple. No masses. No JVD. Lungs are clear. Cardiac exam shows a regular rate and rhythm. Abdomen is soft. Extremities are without edema. Gait and ROM are intact. No gross neurologic deficits noted.  LABORATORY DATA: fasting labs pending   Lab Results  Component Value Date   WBC 5.4 07/23/2009  HGB 11.2* 07/23/2009   HCT 34.8* 07/23/2009   PLT 296 07/23/2009   GLUCOSE 87 07/23/2009   CHOL 241* 07/23/2009   TRIG 148 07/23/2009   HDL 57 07/23/2009   LDLCALC 409* 07/23/2009   ALT 13 07/23/2009   AST 17 07/23/2009   NA 138 07/23/2009   K 4.0 07/23/2009   CL 101 07/23/2009   CREATININE 0.92 07/23/2009   BUN 17 07/23/2009   CO2 24 07/23/2009   INR 1.2 02/13/2009   HGBA1C  Value: 6.0 (NOTE) The ADA recommends the following therapeutic goal for glycemic control related to Hgb A1c measurement: Goal of therapy: <6.5 Hgb A1c  Reference: American Diabetes Association: Clinical Practice Recommendations 2010, Diabetes Care, 2010, 33: (Suppl  1). 02/13/2009    Assessment / Plan: 1. HTN - BP has improved - I have left her on her current regimen. I will see her back in 4 months. She does not have a means to check her BP at home. Encouraged her to  continue to restrict her salt.   2. HLD - checking fasting labs today.   3. Minor nonobstructive CAD - managed medically - no symptoms reported.   Patient is agreeable to this plan and will call if any problems develop in the interim.   Rosalio Macadamia, RN, ANP-C [facility] [address] [place], [place]  [address]

## 2013-04-23 NOTE — Patient Instructions (Addendum)
Stay on your current medicines  We will check fasting labs today  Try to restrict your salt  I will see you in 4 months - be sure and take your medicines on the day that you come back  Call the [facility] office at [phone] if you have any questions, problems or concerns.

## 2013-04-28 ENCOUNTER — Telehealth: Payer: Self-pay | Admitting: Cardiology

## 2013-04-28 MED ORDER — POTASSIUM CHLORIDE CRYS ER 20 MEQ PO TBCR: 20.0000 meq | EXTENDED_RELEASE_TABLET | Freq: Every day | ORAL | Status: DC

## 2013-04-28 NOTE — Telephone Encounter (Signed)
Pt's daughter called because she states someone called pt and gave her the lab results and she does not remember the instructions given. Daughter was given labs results and MD's recommendations. A prescription for Potasium 20 mEq po once a day sent to [facility]. Pt will needs the appointment for labs in 3 months ( February 2015). Pt has an appointment to see Norma Fredrickson in  August 22 2013 at 3:30 PM.

## 2013-04-28 NOTE — Telephone Encounter (Signed)
New message    Someone called last week and gave her mother lab results, however, she does not remember what the nurse said.  Pls call daughter with lab results.

## 2013-05-01 ENCOUNTER — Other Ambulatory Visit: Payer: Self-pay

## 2013-05-01 DIAGNOSIS — I10 Essential (primary) hypertension: Secondary | ICD-10-CM

## 2013-05-01 DIAGNOSIS — E785 Hyperlipidemia, unspecified: Secondary | ICD-10-CM

## 2013-05-01 MED ORDER — POTASSIUM CHLORIDE CRYS ER 20 MEQ PO TBCR: 20.0000 meq | EXTENDED_RELEASE_TABLET | Freq: Every day | ORAL | Status: DC

## 2013-05-01 NOTE — Telephone Encounter (Signed)
Spoke to patient's daughter Suzette Battiest lab results given.Potassium prescription sent to pharmacy.Repeat fasting lab work 08/05/13.

## 2013-07-07 ENCOUNTER — Other Ambulatory Visit: Payer: Self-pay | Admitting: *Deleted

## 2013-07-07 MED ORDER — ROSUVASTATIN CALCIUM 20 MG PO TABS: 20.0000 mg | ORAL_TABLET | Freq: Every day | ORAL | Status: DC

## 2013-08-05 ENCOUNTER — Other Ambulatory Visit: Payer: PRIVATE HEALTH INSURANCE

## 2013-08-11 ENCOUNTER — Other Ambulatory Visit (INDEPENDENT_AMBULATORY_CARE_PROVIDER_SITE_OTHER): Payer: PRIVATE HEALTH INSURANCE

## 2013-08-11 DIAGNOSIS — I10 Essential (primary) hypertension: Secondary | ICD-10-CM

## 2013-08-11 DIAGNOSIS — E785 Hyperlipidemia, unspecified: Secondary | ICD-10-CM

## 2013-08-11 LAB — BASIC METABOLIC PANEL
BUN: 14 mg/dL (ref 6–23)
CALCIUM: 8.9 mg/dL (ref 8.4–10.5)
CO2: 32 meq/L (ref 19–32)
CREATININE: 1.1 mg/dL (ref 0.4–1.2)
Chloride: 102 mEq/L (ref 96–112)
GFR: 53.33 mL/min — ABNORMAL LOW (ref >60.00)
GLUCOSE: 97 mg/dL (ref 70–99)
Potassium: 3.2 mEq/L — ABNORMAL LOW (ref 3.5–5.1)
Sodium: 139 mEq/L (ref 135–145)

## 2013-08-11 LAB — LIPID PANEL
CHOL/HDL RATIO: 3
Cholesterol: 150 mg/dL (ref 0–200)
HDL: 54.5 mg/dL (ref >39.00)
LDL CALC: 66 mg/dL (ref 0–99)
TRIGLYCERIDES: 150 mg/dL — AB (ref 0.0–149.0)
VLDL: 30 mg/dL (ref 0.0–40.0)

## 2013-08-11 LAB — HEPATIC FUNCTION PANEL
ALT: 19 U/L (ref 0–35)
AST: 23 U/L (ref 0–37)
Albumin: 4.2 g/dL (ref 3.5–5.2)
Alkaline Phosphatase: 61 U/L (ref 39–117)
Bilirubin, Direct: 0 mg/dL (ref 0.0–0.3)
Total Bilirubin: 0.5 mg/dL (ref 0.3–1.2)
Total Protein: 7.5 g/dL (ref 6.0–8.3)

## 2013-08-13 ENCOUNTER — Other Ambulatory Visit: Payer: Self-pay

## 2013-08-13 DIAGNOSIS — E876 Hypokalemia: Secondary | ICD-10-CM

## 2013-08-18 ENCOUNTER — Other Ambulatory Visit: Payer: Self-pay

## 2013-08-18 ENCOUNTER — Ambulatory Visit (INDEPENDENT_AMBULATORY_CARE_PROVIDER_SITE_OTHER): Payer: PRIVATE HEALTH INSURANCE | Admitting: Nurse Practitioner

## 2013-08-18 ENCOUNTER — Encounter: Payer: Self-pay | Admitting: Nurse Practitioner

## 2013-08-18 VITALS — BP 170/80 | HR 63 | Ht 62.0 in | Wt 199.8 lb

## 2013-08-18 DIAGNOSIS — E876 Hypokalemia: Secondary | ICD-10-CM

## 2013-08-18 DIAGNOSIS — I10 Essential (primary) hypertension: Secondary | ICD-10-CM

## 2013-08-18 MED ORDER — RAMIPRIL 10 MG PO CAPS: 10.0000 mg | ORAL_CAPSULE | Freq: Every day | ORAL | Status: DC

## 2013-08-18 MED ORDER — OMEPRAZOLE 20 MG PO CPDR: 20.0000 mg | DELAYED_RELEASE_CAPSULE | Freq: Every day | ORAL | Status: DC

## 2013-08-18 MED ORDER — AMLODIPINE BESYLATE 5 MG PO TABS: 5.0000 mg | ORAL_TABLET | Freq: Every day | ORAL | Status: DC

## 2013-08-18 MED ORDER — POTASSIUM CHLORIDE CRYS ER 20 MEQ PO TBCR
EXTENDED_RELEASE_TABLET | ORAL | Status: DC
Start: 2013-08-18 — End: 2013-09-15

## 2013-08-18 NOTE — Progress Notes (Addendum)
Alyssa Graves Date of Birth: Feb 02, 1930 Medical Record #[id number]  History of Present Illness: Alyssa Graves is seen back today for a 4 month check. Seen for Dr. Swazil[place]. She has HTN, HLD and glucose intolerance. Past cardiac evaluation in 2010 with cardiac cath showed minor nonobstructive CAD and normal EF. Myoview from 2011 was normal. No history of stroke or TIA.   Seen back in the fall - BP not controlled and Hydralazine was added. No means of checking her BP at home.   Comes back today. Here with 2 family members. Doing ok. Very hard of hearing. No chest pain. Not short of breath. Her medicines are all messed up - she has a 7 day pill box but with multiple pills in each slot. Definitely missing pills. Daughter notes that there are missing pill bottles. She has no clue as to what she is taking, how she is taking, etc. She is missing medicines and more than likely not taking as directed. Daughter is quite frustrated about her situation.   Current Outpatient Prescriptions  Medication Sig Dispense Refill  . aspirin 81 MG tablet Take 81 mg by mouth daily.       . hydrALAZINE (APRESOLINE) 25 MG tablet Take 1 tablet (25 mg total) by mouth 3 (three) times daily.  270 tablet  3  . hydrochlorothiazide (MICROZIDE) 12.5 MG capsule Take 12.5 mg by mouth daily.      . nitroGLYCERIN (NITROSTAT) 0.4 MG SL tablet Place 0.4 mg under the tongue every 5 (five) minutes as needed. Prescribed by Dr. Sharyn LullHarwani       . potassium chloride SA (K-DUR,KLOR-CON) 20 MEQ tablet Take 1 tablet (20 mEq total) by mouth daily.  30 tablet  6  . rosuvastatin (CRESTOR) 20 MG tablet Take 1 tablet (20 mg total) by mouth daily.  30 tablet  1  . amLODipine (NORVASC) 5 MG tablet Take 1 tablet (5 mg total) by mouth daily.  90 tablet  3  . omeprazole (PRILOSEC) 20 MG capsule Take 1 capsule (20 mg total) by mouth daily.  90 capsule  3  . ramipril (ALTACE) 10 MG capsule Take 1 capsule (10 mg total) by mouth daily.  90 capsule  3   No  current facility-administered medications for this visit.    No Known Allergies  Past Medical History  Diagnosis Date  . HTN (hypertension)   . Hypercholesterolemia   . Glucose intolerance (impaired glucose tolerance)   . GERD (gastroesophageal reflux disease)   . Arthritis     Past Surgical History  Procedure Laterality Date  . Cholecystectomy    . Umbilical hernia repair    . Foot surgery    . Hammer toes      History  Smoking status  . Former Smoker  Smokeless tobacco  . Not on file    History  Alcohol Use No    No family history on file.  Review of Systems: The review of systems is per the HPI.  All other systems were reviewed and are negative.  Physical Exam: BP 170/80  Pulse 63  Ht 5\' 2"  (1.575 m)  Wt 199 lb 12.8 oz (90.629 kg)  BMI 36.53 kg/m2  SpO2 94% Patient is very pleasant and in no acute distress. She is very hard of hearing. Skin is warm and dry. Color is normal.  HEENT is unremarkable. Normocephalic/atraumatic. PERRL. Sclera are nonicteric. Neck is supple. No masses. No JVD. Lungs are clear. Cardiac exam shows a regular rate and rhythm.  Abdomen is soft. Extremities are without edema. Gait and ROM are intact. No gross neurologic deficits noted.  LABORATORY DATA:  Lab Results  Component Value Date   WBC 5.6 04/23/2013   HGB 12.4 04/23/2013   HCT 36.6 04/23/2013   PLT 280.0 04/23/2013   GLUCOSE 97 08/11/2013   CHOL 150 08/11/2013   TRIG 150.0* 08/11/2013   HDL 54.50 08/11/2013   LDLDIRECT 172.9 04/23/2013   LDLCALC 66 08/11/2013   ALT 19 08/11/2013   AST 23 08/11/2013   NA 139 08/11/2013   K 3.2* 08/11/2013   CL 102 08/11/2013   CREATININE 1.1 08/11/2013   BUN 14 08/11/2013   CO2 32 08/11/2013   INR 1.2 02/13/2009   HGBA1C 6.6* 04/23/2013     Assessment / Plan: 1. HTN - BP is too high. Medicines are all messed up and she is certainly not taking as prescribed. She is willing to let her daughter take over the medicines. Will need a 30 day pill box.  Daughter has the list of what she is suppose to be taking. Refills sent to the drug store. See her back in a month.  2. HLD - on statin therapy.   3. Minor nonobstructive CAD per cath with negative Myoview in 2011 - no active chest pain  Will try to refer to the [facility] as well. See her back in a month.   Patient is agreeable to this plan and will call if any problems develop in the interim.   Rosalio Macadamia, RN, ANP-C [facility] [address] [place], [place]  [address] [phone]   Addendum: Trying to arrange for [facility] [employer] assessment and see if she qualifies. Apparently there is no Regulatory affairs officer" form on her record, despite the daughter being listed on her contact list. The patient is EXTREMELY hard of hearing and is obviously incapable of handling her own medicines. [facility] referral is felt to be needed. The patient did verbally consent to letting her daughter handle her medicines, to serve as a contact information to and was agreeable to letting [facility] evaluate.

## 2013-08-18 NOTE — Patient Instructions (Addendum)
Stay on your current medicines - go by this list of medicines  I have sent the Ramipril, the omeprazole and the Microzide to the drug store  Get a 30 day pill box if possible  We will try to get you established with the [facility] - we have to send in a referral and they will let us know if you are accepted.  See me in a month  Call the [facility] office at [phone] if you have any questions, problems or concerns.

## 2013-08-22 ENCOUNTER — Ambulatory Visit: Payer: PRIVATE HEALTH INSURANCE | Admitting: Nurse Practitioner

## 2013-08-29 ENCOUNTER — Other Ambulatory Visit: Payer: Self-pay | Admitting: Cardiology

## 2013-09-03 ENCOUNTER — Other Ambulatory Visit: Payer: PRIVATE HEALTH INSURANCE

## 2013-09-15 ENCOUNTER — Ambulatory Visit (INDEPENDENT_AMBULATORY_CARE_PROVIDER_SITE_OTHER): Payer: PRIVATE HEALTH INSURANCE | Admitting: Nurse Practitioner

## 2013-09-15 ENCOUNTER — Encounter: Payer: Self-pay | Admitting: Nurse Practitioner

## 2013-09-15 ENCOUNTER — Other Ambulatory Visit: Payer: Self-pay | Admitting: *Deleted

## 2013-09-15 VITALS — BP 140/64 | HR 63 | Ht 62.0 in | Wt 201.4 lb

## 2013-09-15 DIAGNOSIS — E785 Hyperlipidemia, unspecified: Secondary | ICD-10-CM

## 2013-09-15 DIAGNOSIS — I2510 Atherosclerotic heart disease of native coronary artery without angina pectoris: Secondary | ICD-10-CM

## 2013-09-15 DIAGNOSIS — I10 Essential (primary) hypertension: Secondary | ICD-10-CM

## 2013-09-15 DIAGNOSIS — R079 Chest pain, unspecified: Secondary | ICD-10-CM

## 2013-09-15 MED ORDER — ISOSORBIDE MONONITRATE ER 30 MG PO TB24: 30.0000 mg | ORAL_TABLET | Freq: Every day | ORAL | Status: DC

## 2013-09-15 MED ORDER — HYDROCHLOROTHIAZIDE 12.5 MG PO CAPS: 12.5000 mg | ORAL_CAPSULE | Freq: Every day | ORAL | Status: DC

## 2013-09-15 MED ORDER — NITROGLYCERIN 0.4 MG SL SUBL: 0.4000 mg | SUBLINGUAL_TABLET | SUBLINGUAL | Status: DC | PRN

## 2013-09-15 NOTE — Progress Notes (Signed)
Alyssa Graves Date of Birth: 02-23-1930 Medical Record #[id number]  History of Present Illness: Alyssa Graves is seen back today for a 1 month check. Seen for Dr. SwazilandJordan. She has HTN, HLD and glucose intolerance. Reports past MI but past cardiac evaluation in 2010 with cardiac cath showed minor nonobstructive CAD and normal EF. Myoview from 2011 was normal. No history of stroke or TIA.   Seen back in the fall of 2014 - BP not controlled and Hydralazine was added. No means of checking her BP at home.   I saw her at the beginning of March - Her medicines were all messed up - She had no clue as to what she was taking, how she was taking, etc. [facility]. Got her to agree to letting the daughter supervise her medicines, refilled what was necessary, 30 day pill box, etc.  Comes back today. Here with her family. Very hard of hearing. She is taking her medicines now. BP is better. Family notes that she has complained of some chest pain. Alyssa Graves says it just comes and goes - hard to say what brings it on or makes it go away. This seems to be a new symptom for her. Not short of breath. [facility] has been in contact and are working on setting up a visit for a nurse to come to the home. She has had all of her labs drawn for an upcoming physical next Monday. Did NOT increase her potassium on last check here.   Current Outpatient Prescriptions  Medication Sig Dispense Refill  . hydrochlorothiazide (MICROZIDE) 12.5 MG capsule Take 12.5 mg by mouth daily.      Marland Kitchen. amLODipine (NORVASC) 5 MG tablet Take 1 tablet (5 mg total) by mouth daily.  90 tablet  3  . aspirin 81 MG tablet Take 81 mg by mouth daily.       . CRESTOR 20 MG tablet TAKE 1 TABLET (20 MG TOTAL) BY MOUTH DAILY.  30 tablet  0  . hydrALAZINE (APRESOLINE) 25 MG tablet Take 1 tablet (25 mg total) by mouth 3 (three) times daily.  270 tablet  3  . nitroGLYCERIN (NITROSTAT) 0.4 MG SL tablet Place 0.4 mg under the tongue every 5 (five)  minutes as needed. Prescribed by Dr. Sharyn LullHarwani       . omeprazole (PRILOSEC) 20 MG capsule Take 1 capsule (20 mg total) by mouth daily.  90 capsule  3  . potassium chloride SA (K-DUR,KLOR-CON) 20 MEQ tablet Take 20 meq twice a day.  60 tablet  6  . ramipril (ALTACE) 10 MG capsule Take 1 capsule (10 mg total) by mouth daily.  90 capsule  3   No current facility-administered medications for this visit.    No Known Allergies  Past Medical History  Diagnosis Date  . HTN (hypertension)   . Hypercholesterolemia   . Glucose intolerance (impaired glucose tolerance)   . GERD (gastroesophageal reflux disease)   . Arthritis     Past Surgical History  Procedure Laterality Date  . Cholecystectomy    . Umbilical hernia repair    . Foot surgery    . Hammer toes      History  Smoking status  . Former Smoker  Smokeless tobacco  . Not on file    History  Alcohol Use No    No family history on file.  Review of Systems: The review of systems is per the HPI.  All other systems were reviewed and  are negative.  Physical Exam: Pulse 63  Ht 5\' 2"  (1.575 m)  Wt 201 lb 6.4 oz (91.354 kg)  BMI 36.83 kg/m2  SpO2 94% Patient is very pleasant and in no acute distress. Skin is warm and dry. Color is normal.  HEENT is unremarkable. Normocephalic/atraumatic. PERRL. Sclera are nonicteric. Neck is supple. No masses. No JVD. Lungs are clear. Cardiac exam shows a regular rate and rhythm. Soft systolic murmur noted. Abdomen is soft. Extremities are without edema. Gait and ROM are intact. No gross neurologic deficits noted.  LABORATORY DATA: EKG today shows sinus - has artifact but looks to have inferior T wave changes.   Lab Results  Component Value Date   WBC 5.6 04/23/2013   HGB 12.4 04/23/2013   HCT 36.6 04/23/2013   PLT 280.0 04/23/2013   GLUCOSE 97 08/11/2013   CHOL 150 08/11/2013   TRIG 150.0* 08/11/2013   HDL 54.50 08/11/2013   LDLDIRECT 172.9 04/23/2013   LDLCALC 66 08/11/2013   ALT 19  08/11/2013   AST 23 08/11/2013   NA 139 08/11/2013   K 3.2* 08/11/2013   CL 102 08/11/2013   CREATININE 1.1 08/11/2013   BUN 14 08/11/2013   CO2 32 08/11/2013   INR 1.2 02/13/2009   HGBA1C 6.6* 04/23/2013    Assessment / Plan: 1. HTN - BP has improved.   2. HLD - on statin therapy.   3. Minor nonobstructive CAD per cath with negative Myoview in 2011 - some recurrent chest pain - will add Imdur 30 mg a day and let family have RX for sl NTG. See her back in a few weeks. May need to update her Myoview if symptoms persist.  4. Hypokalemia - has just had all of her labs checked and seeing PCP next week. Will send a note to him.   I will see her back in about 3 to 4 weeks.   Patient is agreeable to this plan and will call if any problems develop in the interim.   Rosalio Macadamia, RN, ANP-C  [facility]  [address] [place] [address]  [place], [place] [address]  [phone]

## 2013-09-15 NOTE — Patient Instructions (Signed)
Continue with current medicines  I am adding Imdur 30 mg a day - this is a long acting nitroglycerin and may cause a headache - but it is to make you have less chest pain  I have also sent in a RX for sl NTG for your family to give to you - Use your NTG under your tongue for recurrent chest pain. May take one tablet every 5 minutes. If you are still having discomfort after 3 tablets in 15 minutes, call 911.  I will see you in 3 to 4 weeks  When you go for your physical - ask about your potassium level.  Call the [facility] office at [phone] if you have any questions, problems or concerns.

## 2013-09-16 ENCOUNTER — Encounter: Payer: Self-pay | Admitting: Nurse Practitioner

## 2013-09-22 ENCOUNTER — Other Ambulatory Visit: Payer: Self-pay | Admitting: Internal Medicine

## 2013-09-22 DIAGNOSIS — Z1231 Encounter for screening mammogram for malignant neoplasm of breast: Secondary | ICD-10-CM

## 2013-10-13 ENCOUNTER — Ambulatory Visit (INDEPENDENT_AMBULATORY_CARE_PROVIDER_SITE_OTHER): Payer: PRIVATE HEALTH INSURANCE | Admitting: Nurse Practitioner

## 2013-10-13 ENCOUNTER — Encounter: Payer: Self-pay | Admitting: Nurse Practitioner

## 2013-10-13 VITALS — BP 150/70 | HR 66 | Wt 198.0 lb

## 2013-10-13 DIAGNOSIS — I2510 Atherosclerotic heart disease of native coronary artery without angina pectoris: Secondary | ICD-10-CM

## 2013-10-13 DIAGNOSIS — I10 Essential (primary) hypertension: Secondary | ICD-10-CM

## 2013-10-13 DIAGNOSIS — R079 Chest pain, unspecified: Secondary | ICD-10-CM

## 2013-10-13 NOTE — Patient Instructions (Addendum)
Stay on your current medicines  See Dr. SwazilandJordan in 4 months   Call the [facility] office at [phone] if you have any questions, problems or concerns.

## 2013-10-13 NOTE — Progress Notes (Signed)
Alyssa Graves Date of Birth: Aug 30, 1929 Medical Record #[id number]  History of Present Illness: Alyssa Graves is seen back today for a 1 month check. Seen for Dr. SwazilandJordan. She has HTN, HLD and glucose intolerance. Reports past MI but past cardiac evaluation in 2010 with cardiac cath showed minor nonobstructive CAD and normal EF. Myoview from 2011 was normal. No history of stroke or TIA.   Seen back in the fall of 2014 - BP not controlled and Hydralazine was added. No means of checking her BP at home.   I saw her at the beginning of March of 2015 - Her medicines were all messed up - She had no clue as to what she was taking, how she was taking, etc. [facility]. Got her to agree to letting the daughter supervise her medicines, refilled what was necessary, 30 day pill box, etc.   Saw her a month ago. Was taking her medicines. BP had improved. Had complained of some chest pain to her family and this seemed to be a new complaint for her. [facility] has been in contact and were working on setting up a visit for a nurse to come to the home. Had had more recent labs drawn due to upcoming physical. I added Imdur to her regimen.   Comes back today. Here with her family. Remains very hard of hearing. No more chest pain. Says her chest feels good. BP continues to do well. Has had more swelling in her legs - eating way too much salt - likes vienna sausages and SPAM. Limited by her arthritis. [facility] now seeing. Has had her physical. Labs reviewed and were ok. Thyroid medicine adjusted.   Current Outpatient Prescriptions  Medication Sig Dispense Refill  . alendronate (FOSAMAX) 70 MG tablet Take 70 mg by mouth once a week.       Marland Kitchen. amLODipine (NORVASC) 5 MG tablet Take 1 tablet (5 mg total) by mouth daily.  90 tablet  3  . aspirin 81 MG tablet Take 81 mg by mouth daily.       . Calcium Carbonate-Vitamin D (CALCIUM-VITAMIN D) 500-200 MG-UNIT per tablet Take 1 tablet by mouth 2 (two) times daily.      .  Cholecalciferol (VITAMIN D3) 1000 UNITS CAPS Take 2,000 Units by mouth daily.      . CRESTOR 20 MG tablet TAKE 1 TABLET (20 MG TOTAL) BY MOUTH DAILY.  30 tablet  0  . hydrALAZINE (APRESOLINE) 25 MG tablet Take 1 tablet (25 mg total) by mouth 3 (three) times daily.  270 tablet  3  . hydrochlorothiazide (MICROZIDE) 12.5 MG capsule Take 1 capsule (12.5 mg total) by mouth daily.  30 capsule  9  . isosorbide mononitrate (IMDUR) 30 MG 24 hr tablet Take 1 tablet (30 mg total) by mouth daily.  30 tablet  6  . levothyroxine (SYNTHROID, LEVOTHROID) 50 MCG tablet Take 50 mcg by mouth daily before breakfast.      . nitroGLYCERIN (NITROSTAT) 0.4 MG SL tablet Place 1 tablet (0.4 mg total) under the tongue every 5 (five) minutes as needed for chest pain.  25 tablet  3  . omeprazole (PRILOSEC) 20 MG capsule Take 1 capsule (20 mg total) by mouth daily.  90 capsule  3  . potassium chloride SA (K-DUR,KLOR-CON) 20 MEQ tablet Take 20 mEq by mouth daily.       . ramipril (ALTACE) 10 MG capsule Take 1 capsule (10 mg total) by mouth daily.  90  capsule  3   No current facility-administered medications for this visit.    No Known Allergies  Past Medical History  Diagnosis Date  . HTN (hypertension)   . Hypercholesterolemia   . Glucose intolerance (impaired glucose tolerance)   . GERD (gastroesophageal reflux disease)   . Arthritis     Past Surgical History  Procedure Laterality Date  . Cholecystectomy    . Umbilical hernia repair    . Foot surgery    . Hammer toes      History  Smoking status  . Former Smoker  Smokeless tobacco  . Not on file    History  Alcohol Use No    History reviewed. No pertinent family history.  Review of Systems: The review of systems is per the HPI.  All other systems were reviewed and are negative.  Physical Exam: BP 150/70  Pulse 66  Wt 198 lb (89.812 kg) BP by me is 136/60.  Patient is very pleasant and in no acute distress. Very hard of hearing. Her weight is  actually down a few pounds. Skin is warm and dry. Color is normal.  HEENT is unremarkable. Normocephalic/atraumatic. PERRL. Sclera are nonicteric. Neck is supple. No masses. No JVD. Lungs are clear. Cardiac exam shows a regular rate and rhythm. Abdomen is soft. Extremities are with just edema left greater than right. Gait and ROM are intact. No gross neurologic deficits noted.  Wt Readings from Last 3 Encounters:  10/13/13 198 lb (89.812 kg)  09/15/13 201 lb 6.4 oz (91.354 kg)  08/18/13 199 lb 12.8 oz (90.629 kg)     LABORATORY DATA: Lab Results  Component Value Date   WBC 5.6 04/23/2013   HGB 12.4 04/23/2013   HCT 36.6 04/23/2013   PLT 280.0 04/23/2013   GLUCOSE 97 08/11/2013   CHOL 150 08/11/2013   TRIG 150.0* 08/11/2013   HDL 54.50 08/11/2013   LDLDIRECT 172.9 04/23/2013   LDLCALC 66 08/11/2013   ALT 19 08/11/2013   AST 23 08/11/2013   NA 139 08/11/2013   K 3.2* 08/11/2013   CL 102 08/11/2013   CREATININE 1.1 08/11/2013   BUN 14 08/11/2013   CO2 32 08/11/2013   INR 1.2 02/13/2009   HGBA1C 6.6* 04/23/2013     Assessment / Plan: 1. HTN - BP has improved. I have left her on her current regimen. Needs to really restrict her salt. See Dr. SwazilandJordan in 4 months.   2. HLD - on statin therapy.   3. Minor nonobstructive CAD per cath with negative Myoview in 2011 - now on Imdur 30 mg a day - her symptoms have resolved.  Patient is agreeable to this plan and will call if any problems develop in the interim.   Rosalio MacadamiaLori C. Babyboy Loya, RN, ANP-C [facility] [address] [place], [place]  [id number] [phone]

## 2013-10-20 ENCOUNTER — Ambulatory Visit
Admission: RE | Admit: 2013-10-20 | Discharge: 2013-10-20 | Disposition: A | Discharge status: Home / Self Care | Payer: PRIVATE HEALTH INSURANCE | Source: Ambulatory Visit | Attending: Internal Medicine | Admitting: Internal Medicine

## 2013-10-20 ENCOUNTER — Encounter (INDEPENDENT_AMBULATORY_CARE_PROVIDER_SITE_OTHER): Payer: Self-pay

## 2013-10-20 DIAGNOSIS — Z1231 Encounter for screening mammogram for malignant neoplasm of breast: Secondary | ICD-10-CM

## 2014-01-07 ENCOUNTER — Other Ambulatory Visit: Payer: Self-pay | Admitting: Cardiology

## 2014-01-13 ENCOUNTER — Other Ambulatory Visit: Payer: Self-pay | Admitting: *Deleted

## 2014-01-13 MED ORDER — ROSUVASTATIN CALCIUM 20 MG PO TABS: ORAL_TABLET | ORAL | Status: DC

## 2014-01-15 ENCOUNTER — Other Ambulatory Visit: Payer: Self-pay

## 2014-01-15 MED ORDER — ROSUVASTATIN CALCIUM 20 MG PO TABS: ORAL_TABLET | ORAL | Status: DC

## 2014-02-09 ENCOUNTER — Ambulatory Visit: Payer: PRIVATE HEALTH INSURANCE | Admitting: Nurse Practitioner

## 2014-03-23 ENCOUNTER — Ambulatory Visit: Payer: PRIVATE HEALTH INSURANCE | Admitting: Nurse Practitioner

## 2014-03-30 ENCOUNTER — Ambulatory Visit: Payer: PRIVATE HEALTH INSURANCE | Admitting: Nurse Practitioner

## 2014-03-30 ENCOUNTER — Other Ambulatory Visit: Payer: Self-pay | Admitting: Nurse Practitioner

## 2014-04-09 ENCOUNTER — Encounter: Payer: Self-pay | Admitting: Nurse Practitioner

## 2014-04-09 ENCOUNTER — Ambulatory Visit (INDEPENDENT_AMBULATORY_CARE_PROVIDER_SITE_OTHER): Payer: PRIVATE HEALTH INSURANCE | Admitting: Nurse Practitioner

## 2014-04-09 VITALS — BP 130/70 | HR 64 | Ht 63.0 in | Wt 204.1 lb

## 2014-04-09 DIAGNOSIS — E785 Hyperlipidemia, unspecified: Secondary | ICD-10-CM

## 2014-04-09 DIAGNOSIS — Z23 Encounter for immunization: Secondary | ICD-10-CM

## 2014-04-09 DIAGNOSIS — I10 Essential (primary) hypertension: Secondary | ICD-10-CM

## 2014-04-09 NOTE — Progress Notes (Signed)
Alyssa JensenGirtha B Graves Date of Birth: 11/05/1929 Medical Record #[id number]  History of Present Illness: Alyssa Graves is seen back today for a 4 month check. Seen for Dr. SwazilandJordan. She has HTN, HLD and glucose intolerance. Reports past MI but past cardiac evaluation in 2010 with cardiac cath showed minor nonobstructive CAD and normal EF. Myoview from 2011 was normal. No history of stroke or TIA.   Seen back in the fall of 2014 - BP not controlled and Hydralazine was added. No means of checking her BP at home.   I saw her at the beginning of March of 2015 - Her medicines were all messed up - She had no clue as to what she was taking, how she was taking, etc. [facility]. Got her to agree to letting the daughter supervise her medicines, refilled what was necessary, 30 day pill box, etc. Follow up after that visit - she was taking her medicines. BP had improved. Had complained of some chest pain to her family and this seemed to be a new complaint for her. [facility] has been in contact and were working on setting up a visit for a nurse to come to the home.  I added Imdur to her regimen.   On my last visit with her back in April - she was doing well. [facility] was seeing her as well.   Comes back today. Here with her daughter. Remains very hard of hearing. She is doing well. No chest pain. Not short of breath. Will have some hot flashes off and on but currently not having any. Tolerating her medicines - daughter continues to manage her medicines - this is working out well.   Current Outpatient Prescriptions  Medication Sig Dispense Refill  . alendronate (FOSAMAX) 70 MG tablet Take 70 mg by mouth once a week.       Marland Kitchen. amLODipine (NORVASC) 5 MG tablet Take 1 tablet (5 mg total) by mouth daily.  90 tablet  3  . aspirin 81 MG tablet Take 81 mg by mouth daily.       . Calcium Carbonate-Vitamin D (CALCIUM-VITAMIN D) 500-200 MG-UNIT per tablet Take 1 tablet by mouth 2 (two) times daily.      . Cholecalciferol  (VITAMIN D3) 1000 UNITS CAPS Take 2,000 Units by mouth daily.      . hydrALAZINE (APRESOLINE) 25 MG tablet TAKE 1 TABLET (25 MG TOTAL) BY MOUTH THREE   (THREE) TIMES DAILY.  270 tablet  4  . hydrochlorothiazide (MICROZIDE) 12.5 MG capsule Take 1 capsule (12.5 mg total) by mouth daily.  30 capsule  9  . isosorbide mononitrate (IMDUR) 30 MG 24 hr tablet TAKE 1 TABLET (30 MG TOTAL) BY MOUTH DAILY.  30 tablet  3  . levothyroxine (SYNTHROID, LEVOTHROID) 50 MCG tablet Take 50 mcg by mouth daily before breakfast.      . nitroGLYCERIN (NITROSTAT) 0.4 MG SL tablet Place 1 tablet (0.4 mg total) under the tongue every 5 (five) minutes as needed for chest pain.  25 tablet  3  . omeprazole (PRILOSEC) 20 MG capsule Take 1 capsule (20 mg total) by mouth daily.  90 capsule  3  . potassium chloride SA (K-DUR,KLOR-CON) 20 MEQ tablet Take 20 mEq by mouth daily.       . ramipril (ALTACE) 10 MG capsule Take 1 capsule (10 mg total) by mouth daily.  90 capsule  3  . rosuvastatin (CRESTOR) 20 MG tablet TAKE 1 TABLET (20 MG TOTAL) BY  MOUTH DAILY.  30 tablet  4   No current facility-administered medications for this visit.    No Known Allergies  Past Medical History  Diagnosis Date  . HTN (hypertension)   . Hypercholesterolemia   . Glucose intolerance (impaired glucose tolerance)   . GERD (gastroesophageal reflux disease)   . Arthritis     Past Surgical History  Procedure Laterality Date  . Cholecystectomy    . Umbilical hernia repair    . Foot surgery    . Hammer toes      History  Smoking status  . Former Smoker  Smokeless tobacco  . Not on file    History  Alcohol Use No    No family history on file.  Review of Systems: The review of systems is per the HPI.  All other systems were reviewed and are negative.  Physical Exam: Ht 5\' 3"  (1.6 m)  Wt 204 lb 1.9 oz (92.588 kg)  BMI 36.17 kg/m2 Patient is very pleasant and in no acute distress. Very hard of hearing. Skin is warm and dry. Color  is normal.  HEENT is unremarkable. Normocephalic/atraumatic. PERRL. Sclera are nonicteric. Neck is supple. No masses. No JVD. Lungs are clear. Cardiac exam shows a regular rate and rhythm. Abdomen is soft. Extremities are without edema. Gait and ROM are intact. No gross neurologic deficits noted.  Wt Readings from Last 3 Encounters:  04/09/14 204 lb 1.9 oz (92.588 kg)  10/13/13 198 lb (89.812 kg)  09/15/13 201 lb 6.4 oz (91.354 kg)    LABORATORY DATA/PROCEDURES:  Lab Results  Component Value Date   WBC 5.6 04/23/2013   HGB 12.4 04/23/2013   HCT 36.6 04/23/2013   PLT 280.0 04/23/2013   GLUCOSE 97 08/11/2013   CHOL 150 08/11/2013   TRIG 150.0* 08/11/2013   HDL 54.50 08/11/2013   LDLDIRECT 172.9 04/23/2013   LDLCALC 66 08/11/2013   ALT 19 08/11/2013   AST 23 08/11/2013   NA 139 08/11/2013   K 3.2* 08/11/2013   CL 102 08/11/2013   CREATININE 1.1 08/11/2013   BUN 14 08/11/2013   CO2 32 08/11/2013   INR 1.2 02/13/2009   HGBA1C 6.6* 04/23/2013    BNP (last 3 results) No results found for this basename: PROBNP,  in the last 8760 hours   Assessment / Plan: 1. HTN - BP looks good. I have left her on her current regimen.   2. HLD - on statin therapy. [facility]  3. Minor nonobstructive CAD per cath with negative Myoview in 2011 - now on Imdur 30 mg a day - her symptoms have resolved.   Flu shot today. See back in 6 months. Labs by PCP.   Patient is agreeable to this plan and will call if any problems develop in the interim.   Rosalio MacadamiaLori C. Derreon Consalvo, RN, ANP-C [facility] [address] [place], [place]  [id number] [phone]

## 2014-04-09 NOTE — Patient Instructions (Addendum)
Stay on your current medicines  Flu shot today  See me in 6 months  Call the [facility] office at [phone] if you have any questions, problems or concerns.

## 2014-05-27 ENCOUNTER — Other Ambulatory Visit: Payer: Self-pay | Admitting: Cardiology

## 2014-06-03 ENCOUNTER — Other Ambulatory Visit: Payer: Self-pay | Admitting: Nurse Practitioner

## 2014-06-30 ENCOUNTER — Other Ambulatory Visit: Payer: Self-pay | Admitting: Cardiology

## 2014-06-30 ENCOUNTER — Other Ambulatory Visit: Payer: Self-pay | Admitting: Nurse Practitioner

## 2014-07-22 ENCOUNTER — Other Ambulatory Visit: Payer: Self-pay | Admitting: Nurse Practitioner

## 2014-08-10 ENCOUNTER — Other Ambulatory Visit: Payer: Self-pay | Admitting: Nurse Practitioner

## 2014-09-14 ENCOUNTER — Other Ambulatory Visit: Payer: Self-pay | Admitting: Nurse Practitioner

## 2014-10-05 ENCOUNTER — Ambulatory Visit: Payer: PRIVATE HEALTH INSURANCE | Admitting: Nurse Practitioner

## 2014-10-13 ENCOUNTER — Other Ambulatory Visit: Payer: Self-pay | Admitting: Cardiology

## 2014-10-13 NOTE — Telephone Encounter (Signed)
Rx has been sent to the pharmacy electronically. ° °

## 2014-10-19 ENCOUNTER — Ambulatory Visit: Payer: Medicaid Other | Admitting: Nurse Practitioner

## 2014-10-26 ENCOUNTER — Encounter: Payer: Self-pay | Admitting: Physician Assistant

## 2014-10-26 ENCOUNTER — Ambulatory Visit (INDEPENDENT_AMBULATORY_CARE_PROVIDER_SITE_OTHER): Payer: Medicare Other | Admitting: Physician Assistant

## 2014-10-26 VITALS — BP 142/64 | HR 67 | Ht 63.0 in | Wt 207.0 lb

## 2014-10-26 DIAGNOSIS — I2510 Atherosclerotic heart disease of native coronary artery without angina pectoris: Secondary | ICD-10-CM | POA: Diagnosis not present

## 2014-10-26 DIAGNOSIS — I10 Essential (primary) hypertension: Secondary | ICD-10-CM

## 2014-10-26 NOTE — Assessment & Plan Note (Signed)
Recent blood work by primary care. Patient is on Crestor.

## 2014-10-26 NOTE — Assessment & Plan Note (Signed)
Nonobstructive CAD on cath in 2010 negative stress test in 2011. No chest pain.

## 2014-10-26 NOTE — Assessment & Plan Note (Signed)
Blood pressure is a little on the high side that is overall been stable. Recommend 2 g sodium diet and weight loss program. Follow-up with Dr. SwazilandJordan in 6 months. Have asked the blood work to be sent from her primary care's office.

## 2014-10-26 NOTE — Patient Instructions (Signed)
Medication Instructions:  Your physician recommends that you continue on your current medications as directed. Please refer to the Current Medication list given to you today.   Labwork:   Testing/Procedures:   Follow-Up:   Your physician wants you to follow-up in:  6 MONTHS WITH DR SwazilandJORDAN  You will receive a reminder letter in the mail two months in advance. If you don't receive a letter, please call our office to schedule the follow-up appointment.   Any Other Special Instructions Will Be Listed Below (If Applicable).

## 2014-10-26 NOTE — Progress Notes (Signed)
Cardiology Office Note   Date:  10/26/2014   ID:  Alyssa Graves, DOB 1930-02-11, MRN [id number]  PCP:  [name], SCOTT, MD  Cardiologist:  Dr. SwazilandJordan  Chief Complaint:    History of Present Illness: Alyssa Graves is a 81 y.o. female who presents for six-month follow-up.She has HTN, HLD and glucose intolerance. Reports past MI but past cardiac evaluation in 2010 with cardiac cath showed minor nonobstructive CAD and normal EF. Myoview from 2011 was normal. No history of stroke or TIA.   Patient is hard of hearing. She denies any chest pain, palpitations, dyspnea, dizziness or presyncope. She is very inactive. She has a poor diet and eats a lot of snacks. She had blood work by her primary care and will be seen in 2 weeks. They were told only to take 1 potassium daily but the prescriptions still says twice a day.      Past Medical History  Diagnosis Date  . HTN (hypertension)   . Hypercholesterolemia   . Glucose intolerance (impaired glucose tolerance)   . GERD (gastroesophageal reflux disease)   . Arthritis     Past Surgical History  Procedure Laterality Date  . Cholecystectomy    . Umbilical hernia repair    . Foot surgery    . Hammer toes       Current Outpatient Prescriptions  Medication Sig Dispense Refill  . alendronate (FOSAMAX) 70 MG tablet Take 70 mg by mouth once a week.     Marland Kitchen. amLODipine (NORVASC) 5 MG tablet TAKE 1 TABLET (5 MG TOTAL) BY MOUTH DAILY. 90 tablet 1  . aspirin 81 MG tablet Take 81 mg by mouth daily.     . Cholecalciferol (VITAMIN D3) 1000 UNITS CAPS Take 2,000 Units by mouth daily.    . CRESTOR 20 MG tablet TAKE 1 TABLET (20 MG TOTAL) BY MOUTH DAILY. 30 tablet 6  . hydrALAZINE (APRESOLINE) 25 MG tablet TAKE 1 TABLET (25 MG TOTAL) BY MOUTH THREE TIMES DAILY. 270 tablet 0  . hydrochlorothiazide (MICROZIDE) 12.5 MG capsule TAKE 1 CAPSULE (12.5 MG TOTAL) BY MOUTH DAILY. 30 capsule 6  . isosorbide mononitrate (IMDUR) 30 MG 24 hr tablet TAKE 1 TABLET  (30 MG TOTAL) BY MOUTH DAILY. 30 tablet 6  . levothyroxine (SYNTHROID, LEVOTHROID) 50 MCG tablet Take 50 mcg by mouth daily before breakfast.    . nitroGLYCERIN (NITROSTAT) 0.4 MG SL tablet Place 1 tablet (0.4 mg total) under the tongue every 5 (five) minutes as needed for chest pain. 25 tablet 3  . omeprazole (PRILOSEC) 20 MG capsule TAKE 1 CAPSULE BY MOUTH ONCE DAILY 90 capsule 0  . potassium chloride SA (K-DUR,KLOR-CON) 20 MEQ tablet TAKE ONE TABLET   (20 MEQ TOTAL) BY MOUTH   TWICE A DAY 60 tablet 6  . ramipril (ALTACE) 10 MG capsule TAKE 1 CAPSULE (10 MG TOTAL) BY MOUTH DAILY. 90 capsule 0   No current facility-administered medications for this visit.    Allergies:   Review of patient's allergies indicates no known allergies.    Social History:  The patient  reports that she has quit smoking. She does not have any smokeless tobacco history on file. She reports that she does not drink alcohol or use illicit drugs.   Family History:  The patient's    family history is not on file.    ROS:  Please see the history of present illness.   Otherwise, review of systems are positive for severe arthritis in her hands, chronic  knee pain, hard of hearing.   All other systems are reviewed and negative.    PHYSICAL EXAM: VS:  BP 142/64 mmHg  Pulse 67  Ht 5\' 3"  (1.6 m)  Wt 207 lb (93.895 kg)  BMI 36.68 kg/m2 , BMI Body mass index is 36.68 kg/(m^2). GEN: Apical obese, well developed, in no acute distress Neck: no JVD, HJR, carotid bruits, or masses Cardiac: RRR; 2/6 systolic murmur at the left sternal border, no gallop, rubs, thrill or heave,  Respiratory:  Decreased breath sounds but clear to auscultation bilaterally, normal work of breathing GI: soft, nontender, nondistended, + BS MS: no deformity or atrophy Extremities: without cyanosis, clubbing, edema, good distal pulses bilaterally.  Skin: warm and dry, no rash Neuro:  Strength and sensation are intact    EKG:  EKG is ordered  today. The ekg ordered today demonstrates normal sinus rhythm at 67 bpm with LVH and nonspecific ST-T wave changes, no acute change from prior EKG   Recent Labs: No results found for requested labs within last 365 days.    Lipid Panel    Component Value Date/Time   CHOL 150 08/11/2013 1041   TRIG 150.0* 08/11/2013 1041   HDL 54.50 08/11/2013 1041   CHOLHDL 3 08/11/2013 1041   VLDL 30.0 08/11/2013 1041   LDLCALC 66 08/11/2013 1041   LDLDIRECT 172.9 04/23/2013 0858      Wt Readings from Last 3 Encounters:  10/26/14 207 lb (93.895 kg)  04/09/14 204 lb 1.9 oz (92.588 kg)  10/13/13 198 lb (89.812 kg)      Other studies Reviewed: Additional studies/ records that were reviewed today include and review of the records demonstrates: Catheterization 2010  FINDINGS:  The LV showed good LV systolic function, LVH, EF of 16-10%55-60%,  and left main was patent.  LAD has 10-15% proximal and mid stenosis and  20-25% distal stenosis.  Diagonal-1 has 50-60% ostial stenosis.  The  vessel is very small.  Ramus has mild disease.  Left circumflex has 10-  15% proximal and distal stenosis.  OM-1 is very  very small.  OM-2 is moderate size which has 15-20% proximal stenosis.  RCA has 20-25% ostial and 10-15% proximal and mid stenosis.  PDA and PLV  branches were small which were patent.  The patient tolerated the  procedure well.  There were no complications.  The patient was  transferred to recovery room in stable condition.   Stress Myoview 2011 IMPRESSION:    1.  No evidence of myocardial ischemia or infarction. 2.  Hypokinesis involving the inferior wall, septum, and apex.  Is there clinical evidence of cardiomyopathy? 3.  Estimated Q G S ejection fraction 52%.      ASSESSMENT AND PLAN:  Essential hypertension Blood pressure is a little on the high side that is overall been stable. Recommend 2 g sodium diet and weight loss program. Follow-up with Dr. SwazilandJordan in 6 months. Have asked the  blood work to be sent from her primary care's office.   Coronary atherosclerosis Nonobstructive CAD on cath in 2010 negative stress test in 2011. No chest pain.   HYPERLIPIDEMIA Recent blood work by primary care. Patient is on Crestor.     Elson ClanSigned, Kynsley Whitehouse, PA-C  10/26/2014 10:43 AM    [facility] [address], [place], [place]  [address] Phone: [phone]) [phone]; Fax: [phone]

## 2014-11-10 ENCOUNTER — Other Ambulatory Visit: Payer: Self-pay | Admitting: Cardiology

## 2014-12-07 ENCOUNTER — Other Ambulatory Visit: Payer: Self-pay | Admitting: Cardiology

## 2014-12-07 NOTE — Telephone Encounter (Signed)
E sent to pharmacy 

## 2014-12-11 ENCOUNTER — Other Ambulatory Visit: Payer: Self-pay | Admitting: Cardiology

## 2014-12-11 NOTE — Telephone Encounter (Signed)
Rx(s) sent to pharmacy electronically.  

## 2015-01-12 ENCOUNTER — Other Ambulatory Visit: Payer: Self-pay | Admitting: Cardiology

## 2015-01-19 ENCOUNTER — Encounter: Payer: Self-pay | Admitting: Podiatry

## 2015-01-19 ENCOUNTER — Ambulatory Visit (INDEPENDENT_AMBULATORY_CARE_PROVIDER_SITE_OTHER): Payer: Medicare Other | Admitting: Podiatry

## 2015-01-19 VITALS — BP 132/58 | HR 64 | Temp 98.4°F | Resp 14

## 2015-01-19 DIAGNOSIS — M79676 Pain in unspecified toe(s): Secondary | ICD-10-CM

## 2015-01-19 DIAGNOSIS — B351 Tinea unguium: Secondary | ICD-10-CM | POA: Diagnosis not present

## 2015-01-19 DIAGNOSIS — L84 Corns and callosities: Secondary | ICD-10-CM | POA: Diagnosis not present

## 2015-01-19 NOTE — Progress Notes (Signed)
   Subjective:    Patient ID: Alyssa Graves, female    DOB: 1930/06/14, 81 y.o.   MRN: [id number]  HPI  This patient presents today complaining of painful toenails when walking wearing shoes symptomatic over the past several years without any recent professional care. She has attempted to trim the nails himself, however, she was not able to adequately debride the nails. Denies any use of oral topical medication for the nails   Review of Systems  HENT: Positive for hearing loss.   Cardiovascular: Positive for chest pain and leg swelling.       Objective:   Physical Exam  Patient appears orientated 3 her daughter is present to treat room today Patient has extremely difficult time hearing and as result responding to questioning. Her daughter helps her answer the questions primarily because of her hearing problems  Vascular: Pitting edema bilaterally DP pulse right 2/4 and 1/4 left PT pulse right 1/4 and 2/4 left Capillary reflex immediate bilaterally  Neurological: Sensation to 10 g monofilament wire intact 4/5 bilaterally Vibratory sensation patient had difficulty understanding the response and was not able to answer accurately Ankle reflexes equal and reactive bilaterally  Dermatological: Well-healed surgical scars noted on the dorsal aspect toes  2-5 bilaterally Dry skin bilaterally The toenails are extremely elongated, discolored, incurvated, hypertrophic, deformed and tender to palpation 6-10 Distal keratoses second left toe and keratoses fifth left toe Mild diffuse plantar callus bilaterally  Musculoskeletal: There is no restriction ankle, subtalar, midtarsal joints bilaterally       Assessment & Plan:   Assessment: Satisfactory neurovascular status Neglected symptomatic onychomycoses 6-10 Keratoses 2  Plan: Debridement toenails mechanically and electrically without any bleeding 6-10 Debrided keratoses 2 without any bleeding  Reappoint 3 months for  skin a nail debridement

## 2015-02-10 ENCOUNTER — Other Ambulatory Visit: Payer: Self-pay | Admitting: Cardiology

## 2015-02-16 ENCOUNTER — Other Ambulatory Visit: Payer: Self-pay | Admitting: Cardiology

## 2015-02-16 NOTE — Telephone Encounter (Signed)
REFILL 

## 2015-03-11 ENCOUNTER — Other Ambulatory Visit: Payer: Self-pay | Admitting: Cardiology

## 2015-04-18 ENCOUNTER — Emergency Department (HOSPITAL_COMMUNITY): Payer: Medicare Other

## 2015-04-18 ENCOUNTER — Encounter (HOSPITAL_COMMUNITY): Payer: Self-pay

## 2015-04-18 ENCOUNTER — Inpatient Hospital Stay (HOSPITAL_COMMUNITY)
Admission: EM | Admit: 2015-04-18 | Discharge: 2015-04-22 | DRG: 392 | Disposition: A | Discharge status: Home Health | Payer: Medicare Other | Attending: Internal Medicine | Admitting: Internal Medicine

## 2015-04-18 DIAGNOSIS — E7800 Pure hypercholesterolemia, unspecified: Secondary | ICD-10-CM | POA: Diagnosis present

## 2015-04-18 DIAGNOSIS — N179 Acute kidney failure, unspecified: Secondary | ICD-10-CM | POA: Diagnosis present

## 2015-04-18 DIAGNOSIS — R7302 Impaired glucose tolerance (oral): Secondary | ICD-10-CM

## 2015-04-18 DIAGNOSIS — N390 Urinary tract infection, site not specified: Secondary | ICD-10-CM | POA: Diagnosis present

## 2015-04-18 DIAGNOSIS — K529 Noninfective gastroenteritis and colitis, unspecified: Secondary | ICD-10-CM | POA: Diagnosis not present

## 2015-04-18 DIAGNOSIS — Z7982 Long term (current) use of aspirin: Secondary | ICD-10-CM

## 2015-04-18 DIAGNOSIS — I880 Nonspecific mesenteric lymphadenitis: Secondary | ICD-10-CM | POA: Diagnosis present

## 2015-04-18 DIAGNOSIS — E785 Hyperlipidemia, unspecified: Secondary | ICD-10-CM | POA: Diagnosis present

## 2015-04-18 DIAGNOSIS — E039 Hypothyroidism, unspecified: Secondary | ICD-10-CM | POA: Diagnosis present

## 2015-04-18 DIAGNOSIS — K219 Gastro-esophageal reflux disease without esophagitis: Secondary | ICD-10-CM | POA: Diagnosis present

## 2015-04-18 DIAGNOSIS — I10 Essential (primary) hypertension: Secondary | ICD-10-CM | POA: Diagnosis present

## 2015-04-18 DIAGNOSIS — D649 Anemia, unspecified: Secondary | ICD-10-CM | POA: Diagnosis present

## 2015-04-18 DIAGNOSIS — R079 Chest pain, unspecified: Secondary | ICD-10-CM | POA: Diagnosis present

## 2015-04-18 DIAGNOSIS — I2510 Atherosclerotic heart disease of native coronary artery without angina pectoris: Secondary | ICD-10-CM | POA: Diagnosis present

## 2015-04-18 DIAGNOSIS — R109 Unspecified abdominal pain: Secondary | ICD-10-CM | POA: Diagnosis present

## 2015-04-18 DIAGNOSIS — R1013 Epigastric pain: Secondary | ICD-10-CM | POA: Diagnosis present

## 2015-04-18 DIAGNOSIS — I252 Old myocardial infarction: Secondary | ICD-10-CM

## 2015-04-18 DIAGNOSIS — R197 Diarrhea, unspecified: Secondary | ICD-10-CM | POA: Diagnosis not present

## 2015-04-18 DIAGNOSIS — R100 Acute abdomen: Secondary | ICD-10-CM

## 2015-04-18 LAB — COMPREHENSIVE METABOLIC PANEL
ALBUMIN: 4.1 g/dL (ref 3.5–5.0)
ALT: 13 U/L — ABNORMAL LOW (ref 14–54)
ANION GAP: 12 (ref 5–15)
AST: 23 U/L (ref 15–41)
Alkaline Phosphatase: 50 U/L (ref 38–126)
BILIRUBIN TOTAL: 0.4 mg/dL (ref 0.3–1.2)
BUN: 21 mg/dL — AB (ref 6–20)
CHLORIDE: 105 mmol/L (ref 101–111)
CO2: 20 mmol/L — AB (ref 22–32)
Calcium: 8.8 mg/dL — ABNORMAL LOW (ref 8.9–10.3)
Creatinine, Ser: 1.57 mg/dL — ABNORMAL HIGH (ref 0.44–1.00)
GFR calc Af Amer: 34 mL/min — ABNORMAL LOW (ref >60)
GFR calc non Af Amer: 29 mL/min — ABNORMAL LOW (ref >60)
GLUCOSE: 128 mg/dL — AB (ref 65–99)
POTASSIUM: 3.9 mmol/L (ref 3.5–5.1)
SODIUM: 137 mmol/L (ref 135–145)
Total Protein: 6.7 g/dL (ref 6.5–8.1)

## 2015-04-18 LAB — CBC WITH DIFFERENTIAL/PLATELET
Basophils Absolute: 0 10*3/uL (ref 0.0–0.1)
Basophils Relative: 0 %
EOS PCT: 1 %
Eosinophils Absolute: 0.1 10*3/uL (ref 0.0–0.7)
HEMATOCRIT: 32.1 % — AB (ref 36.0–46.0)
Hemoglobin: 10.6 g/dL — ABNORMAL LOW (ref 12.0–15.0)
LYMPHS ABS: 0.8 10*3/uL (ref 0.7–4.0)
LYMPHS PCT: 9 %
MCH: 30.2 pg (ref 26.0–34.0)
MCHC: 33 g/dL (ref 30.0–36.0)
MCV: 91.5 fL (ref 78.0–100.0)
MONO ABS: 0.7 10*3/uL (ref 0.1–1.0)
Monocytes Relative: 8 %
NEUTROS ABS: 7.4 10*3/uL (ref 1.7–7.7)
Neutrophils Relative %: 82 %
PLATELETS: 239 10*3/uL (ref 150–400)
RBC: 3.51 MIL/uL — ABNORMAL LOW (ref 3.87–5.11)
RDW: 14 % (ref 11.5–15.5)
WBC: 9 10*3/uL (ref 4.0–10.5)

## 2015-04-18 LAB — I-STAT TROPONIN, ED: Troponin i, poc: 0 ng/mL (ref 0.00–0.08)

## 2015-04-18 LAB — LIPASE, BLOOD: LIPASE: 31 U/L (ref 11–51)

## 2015-04-18 MED ORDER — PANTOPRAZOLE SODIUM 40 MG PO TBEC
40.0000 mg | DELAYED_RELEASE_TABLET | Freq: Every day | ORAL | Status: DC
  Administered 2015-04-19 – 2015-04-22 (×4): 40 mg via ORAL
  Filled 2015-04-18 (×4): qty 1

## 2015-04-18 MED ORDER — GABAPENTIN 100 MG PO CAPS
100.0000 mg | ORAL_CAPSULE | Freq: Three times a day (TID) | ORAL | Status: DC | PRN
  Filled 2015-04-18: qty 1

## 2015-04-18 MED ORDER — ONDANSETRON HCL 4 MG/2ML IJ SOLN
4.0000 mg | Freq: Once | INTRAMUSCULAR | Status: AC
  Administered 2015-04-18: 4 mg via INTRAVENOUS
  Filled 2015-04-18: qty 2

## 2015-04-18 MED ORDER — SODIUM CHLORIDE 0.9 % IV SOLN
INTRAVENOUS | Status: AC
  Administered 2015-04-18: 21:00:00 via INTRAVENOUS

## 2015-04-18 MED ORDER — ONDANSETRON HCL 4 MG/2ML IJ SOLN: 4.0000 mg | Freq: Four times a day (QID) | INTRAMUSCULAR | Status: DC | PRN

## 2015-04-18 MED ORDER — ENOXAPARIN SODIUM 30 MG/0.3ML ~~LOC~~ SOLN
30.0000 mg | SUBCUTANEOUS | Status: DC
  Administered 2015-04-18: 30 mg via SUBCUTANEOUS
  Filled 2015-04-18: qty 0.3

## 2015-04-18 MED ORDER — HYDRALAZINE HCL 25 MG PO TABS
25.0000 mg | ORAL_TABLET | Freq: Three times a day (TID) | ORAL | Status: DC
  Administered 2015-04-18 – 2015-04-22 (×12): 25 mg via ORAL
  Filled 2015-04-18 (×12): qty 1

## 2015-04-18 MED ORDER — FENTANYL CITRATE (PF) 100 MCG/2ML IJ SOLN
50.0000 ug | Freq: Once | INTRAMUSCULAR | Status: AC
  Administered 2015-04-18: 50 ug via INTRAVENOUS
  Filled 2015-04-18: qty 2

## 2015-04-18 MED ORDER — OXYCODONE HCL 5 MG PO TABS
5.0000 mg | ORAL_TABLET | ORAL | Status: DC | PRN
  Administered 2015-04-18: 5 mg via ORAL
  Filled 2015-04-18 (×2): qty 1

## 2015-04-18 MED ORDER — SODIUM CHLORIDE 0.9 % IV BOLUS (SEPSIS)
1000.0000 mL | Freq: Once | INTRAVENOUS | Status: AC
  Administered 2015-04-18: 1000 mL via INTRAVENOUS

## 2015-04-18 MED ORDER — SODIUM CHLORIDE 0.9 % IJ SOLN
3.0000 mL | Freq: Two times a day (BID) | INTRAMUSCULAR | Status: DC
Start: 2015-04-18 — End: 2015-04-22
  Administered 2015-04-21 – 2015-04-22 (×2): 3 mL via INTRAVENOUS

## 2015-04-18 MED ORDER — SODIUM CHLORIDE 0.9 % IV BOLUS (SEPSIS)
500.0000 mL | Freq: Once | INTRAVENOUS | Status: AC
  Administered 2015-04-18: 500 mL via INTRAVENOUS

## 2015-04-18 MED ORDER — ASPIRIN EC 81 MG PO TBEC
81.0000 mg | DELAYED_RELEASE_TABLET | Freq: Every day | ORAL | Status: DC
  Administered 2015-04-19 – 2015-04-22 (×4): 81 mg via ORAL
  Filled 2015-04-18 (×4): qty 1

## 2015-04-18 MED ORDER — NITROGLYCERIN 0.4 MG SL SUBL: 0.4000 mg | SUBLINGUAL_TABLET | SUBLINGUAL | Status: DC | PRN

## 2015-04-18 MED ORDER — LEVOTHYROXINE SODIUM 50 MCG PO TABS
50.0000 ug | ORAL_TABLET | Freq: Every day | ORAL | Status: DC
  Administered 2015-04-19 – 2015-04-22 (×4): 50 ug via ORAL
  Filled 2015-04-18 (×4): qty 1

## 2015-04-18 MED ORDER — ISOSORBIDE MONONITRATE ER 30 MG PO TB24
30.0000 mg | ORAL_TABLET | Freq: Every day | ORAL | Status: DC
  Administered 2015-04-19 – 2015-04-22 (×4): 30 mg via ORAL
  Filled 2015-04-18 (×4): qty 1

## 2015-04-18 MED ORDER — ALUM & MAG HYDROXIDE-SIMETH 200-200-20 MG/5ML PO SUSP: 30.0000 mL | Freq: Four times a day (QID) | ORAL | Status: DC | PRN

## 2015-04-18 MED ORDER — AMLODIPINE BESYLATE 10 MG PO TABS
10.0000 mg | ORAL_TABLET | Freq: Every day | ORAL | Status: DC
  Administered 2015-04-18 – 2015-04-21 (×4): 10 mg via ORAL
  Filled 2015-04-18 (×4): qty 1

## 2015-04-18 MED ORDER — SODIUM CHLORIDE 0.9 % IV SOLN
INTRAVENOUS | Status: DC
  Administered 2015-04-21 – 2015-04-22 (×2): via INTRAVENOUS

## 2015-04-18 MED ORDER — ONDANSETRON HCL 4 MG/2ML IJ SOLN
4.0000 mg | Freq: Three times a day (TID) | INTRAMUSCULAR | Status: DC | PRN
  Filled 2015-04-18: qty 2

## 2015-04-18 MED ORDER — ONDANSETRON HCL 4 MG PO TABS: 4.0000 mg | ORAL_TABLET | Freq: Four times a day (QID) | ORAL | Status: DC | PRN

## 2015-04-18 NOTE — ED Notes (Signed)
Pt. Presents with complaint of abd pain. Per EMS family states pt. Has had shingles. Pt. Family told EMS she was diaphoretic after cooking this morning. Pt. Not providing any information at this time.

## 2015-04-18 NOTE — ED Notes (Signed)
Pt placed on monitor upon return to room from radiology. Pt remains monitored by blood pressure, pulse ox, and 12 lead. pts family remains at bedside.

## 2015-04-18 NOTE — ED Notes (Signed)
In room to transport patient upstairs.  Noted to be nauseated and vomiting clear liquid.  Medicated per order for nausea.  Incontinence care given at this time.  Family remains at the bedside.  To be transported by ED tech.

## 2015-04-18 NOTE — ED Notes (Signed)
Pt placed into gown and on to 12 lead upon arrival to room. Pt monitored by blood pressure, pulse ox, and 12 lead. pts EKG read and signed by Dr. Criss Alvinegoldston

## 2015-04-18 NOTE — ED Notes (Signed)
Pt remains monitored by blood pressure, pulse ox, and 12 lead.  

## 2015-04-18 NOTE — H&P (Signed)
[facility] History and Physical  Alyssa Graves RUE:[id number] DOB: 27-May-1930 DOA: 04/18/2015  Referring physician: Jeraldine Loots PCP: [name], MD  Specialists: one  Chief Complaint: abd pain  HPI:  81 y/o ? known CAD with small non-q -wave MI in tha pst Prior CHF? [no prior echo in system] HTn Elevated cbg without diagnosis DM HLd Anemia gerd  Recently Rx ~ 1 mo ago for shingles of the nape of neck and back  doing fairly well until 04/18/15 when she started to experience severe abdominal pain in the center of the stomach Associated with some nausea however no diarrhea No other recent antibiotics No chest pain No dysuria No tarry stool No blurred vision No cough no cold no chills no regular   states that when she passes stool has been dark recently Cannot pass stool now will try to do so earlier because she was feeling weak and she came over to the hospital for evaluation  In the emergency room it was noted that B and/21 and 1.57, LFTs were within normal range,   CT of abdomen and pelvis showed extensive colonic diverticulosis with clustered lymph nodes suggesting mild mesenteric this or adenitis vasculopathy without free air or other intra-abdominal air collection or abscess  Unclear as to if she's had a previous colonoscopy    Because of severe pain and inability to take by mouth. Patient was referred for admission    Past Medical History  Diagnosis Date  . HTN (hypertension)   . Hypercholesterolemia   . Glucose intolerance (impaired glucose tolerance)   . GERD (gastroesophageal reflux disease)   . Arthritis    Past Surgical History  Procedure Laterality Date  . Cholecystectomy    . Umbilical hernia repair    . Foot surgery    . Hammer toes     Social History:  Social History   Social History Narrative    No Known Allergies  No family history on file.    HTn and DM in mother and father  Prior to Admission medications    Medication Sig Start Date End Date Taking? Authorizing Provider  alendronate (FOSAMAX) 70 MG tablet Take 70 mg by mouth once a week.  08/18/13   Historical Provider, MD  amLODipine (NORVASC) 10 MG tablet  01/07/15   Historical Provider, MD  aspirin 81 MG tablet Take 81 mg by mouth daily.     Historical Provider, MD  Calcium Carbonate (CALCIUM 600 PO) Take by mouth 2 (two) times daily.    Historical Provider, MD  Cholecalciferol (VITAMIN D3) 1000 UNITS CAPS Take 2,000 Units by mouth daily.    Historical Provider, MD  CRESTOR 20 MG tablet TAKE 1 TABLET (20 MG TOTAL) BY MOUTH DAILY. 01/12/15   Peter M Swaziland, MD  hydrALAZINE (APRESOLINE) 25 MG tablet TAKE 1 TABLET (25 MG TOTAL) BY MOUTH THREE TIMES DAILY. 03/12/15   Peter M [place], MD  hydrochlorothiazide (MICROZIDE) 12.5 MG capsule TAKE 1 CAPSULE (12.5 MG TOTAL) BY MOUTH DAILY. 01/12/15   Peter M [place], MD  isosorbide mononitrate (IMDUR) 30 MG 24 hr tablet TAKE 1 TABLET (30 MG TOTAL) BY MOUTH DAILY. 02/10/15   Peter M [place], MD  levothyroxine (SYNTHROID, LEVOTHROID) 50 MCG tablet Take 50 mcg by mouth daily before breakfast.    Historical Provider, MD  nitroGLYCERIN (NITROSTAT) 0.4 MG SL tablet Place 1 tablet (0.4 mg total) under the tongue every 5 (five) minutes as needed for chest pain. 09/15/13   Rosalio Macadamia, NP  omeprazole (  PRILOSEC) 20 MG capsule TAKE 1 CAPSULE BY MOUTH ONCE DAILY 02/16/15   Peter M [place], MD  potassium chloride SA (K-DUR,KLOR-CON) 20 MEQ tablet Take 1 tablet (20 mEq total) by mouth 2 (two) times daily. 12/11/14   Peter M [place], MD  ramipril (ALTACE) 10 MG capsule TAKE 1 CAPSULE (10 MG TOTAL) BY MOUTH DAILY. 12/07/14   Peter M [place], MD   Physical Exam: Filed Vitals:   04/18/15 1615 04/18/15 1649 04/18/15 1700 04/18/15 1715  BP: 141/59 146/44 146/73 141/98  Pulse: 71 69 73 74  Temp:      TempSrc:      Resp: SpO2: 91% 96% 92% 98%   00 EOMI ncat Edentulous Dry  Mucosa no ict no pallor s1 s 2no m/r/g, no  rales rhonchi abd distended , BS decreased, no rebound but tender bilat in lower quadrants No mcburney point tenderness   Labs on Admission:  Basic Metabolic Panel:  Recent Labs Lab 04/18/15 1400  NA 137  K 3.9  CL 105  CO2 20*  GLUCOSE 128*  BUN 21*  CREATININE 1.57*  CALCIUM 8.8*   Liver Function Tests:  Recent Labs Lab 04/18/15 1400  AST 23  ALT 13*  ALKPHOS 50  BILITOT 0.4  PROT 6.7  ALBUMIN 4.1    Recent Labs Lab 04/18/15 1400  LIPASE 31   No results for input(s): AMMONIA in the last 168 hours. CBC:  Recent Labs Lab 04/18/15 1400  WBC 9.0  NEUTROABS 7.4  HGB 10.6*  HCT 32.1*  MCV 91.5  PLT 239   Cardiac Enzymes: No results for input(s): CKTOTAL, CKMB, CKMBINDEX, TROPONINI in the last 168 hours.  BNP (last 3 results) No results for input(s): BNP in the last 8760 hours.  ProBNP (last 3 results) No results for input(s): PROBNP in the last 8760 hours.  CBG: No results for input(s): GLUCAP in the last 168 hours.  Radiological Exams on Admission: Ct Abdomen Pelvis Wo Contrast  04/18/2015  CLINICAL DATA:  Acute abdominal pain. Diarrhea and nausea. Dysuria. EXAM: CT ABDOMEN AND PELVIS WITHOUT CONTRAST TECHNIQUE: Multidetector CT imaging of the abdomen and pelvis was performed following the standard protocol without IV contrast. COMPARISON:  None. FINDINGS: Lower chest:  No acute findings. Hepatobiliary: Status post cholecystectomy.  Liver appears normal. Pancreas: No mass or inflammatory process identified on this un-enhanced exam. Spleen: Within normal limits in size. Adrenals/Urinary Tract: Kidneys are unremarkable without stone or hydronephrosis. No ureteral or bladder calculi identified. Bladder appears normal. No bladder wall thickening. Stomach/Bowel: Fairly extensive diverticulosis throughout the descending and sigmoid colon but no inflammatory change to suggest acute diverticulitis. No dilated large or small bowel loops. No evidence of bowel wall  inflammation. Vascular/Lymphatic: Prominent atherosclerotic changes of the normal-caliber abdominal aorta and branch vessels. No enlarged lymph nodes. Subtle edema-like change noted within the left abdominal mesentery with clustered small lymph nodes suggesting mild mesenteritis or mesenteric adenitis. Reproductive: Unremarkable. Other: No free fluid or abscess collections seen. No free intraperitoneal air. Musculoskeletal: Degenerative changes throughout the thoracolumbar spine but no acute osseous abnormality. IMPRESSION: 1. Extensive colonic diverticulosis, predominantly involving the descending colon and sigmoid colon, but NO evidence of acute diverticulitis. 2. Mild edema-like change within the left abdominal mesentery, with associated small clustered lymph nodes, suggesting a mild mesenteritis or mesenteric adenitis. 3. Extensive vasculopathy. 4. Remainder of the abdomen and pelvis CT is unremarkable. No free fluid or abscess. No free intraperitoneal air. No bowel obstruction. No evidence of  acute solid organ abnormality. No renal or ureteral calculi. No evidence of pyelonephritis or cystitis. Electronically Signed   By: Bary RichardStan  Maynard M.D.   On: 04/18/2015 17:02   Dg Chest Portable 1 View  04/18/2015  CLINICAL DATA:  Chest and abdominal pain. History of hypertension and gastroesophageal reflux. EXAM: PORTABLE CHEST 1 VIEW COMPARISON:  Chest x-ray dated 02/05/2009. FINDINGS: Cardiomegaly, moderate to severe in degree, is unchanged. There is again a mild central pulmonary vascular congestion and mild bilateral interstitial edema. Evaluation of the left lung base limited by the overlying heart shadow. No evidence of pneumonia within the remainder of the lungs. No large pleural effusion seen. No pneumothorax. No acute osseous abnormality. IMPRESSION: Stable cardiomegaly, moderate to severe in degree. Continued central pulmonary vascular congestion and mild interstitial edema suggesting a mild chronic volume  overload/CHF. No new lung findings seen. Electronically Signed   By: Bary RichardStan  Maynard M.D.   On: 04/18/2015 15:34    EKG: Independently reviewed. NSR No acute st-t changed  Assessment/Plan Principal Problem:   Abdominal pain, acute Mesenteric adenitis Would check Cdiff PCR given report of diarr, but she is having difficulty passing stool so this is [employer] Recheck with serial exam in am Cycle CBC Expectant mangement with IV saline and pain control with oxycodone for now Active Problems:   ANEMIA, NORMOCYTIC, CHRONIC Hb 10 range. No further work-up   Essential hypertension Would hold HCTZ 12.5-daughter tells me has had some LE edema, but with elevated creatinine would monitor Bun/cre prior to restarting,  Hold ramipril 10 as well for now.  contionue hydralazine 25 q 8   Coronary atherosclerosis followed by Dr. Jennette KettleHarwani-low concern of this being Cardiac.  EKG wnl Continue Amlodipine 10, Imdur 30 No work-up needed currently   Glucose intolerance (impaired glucose tolerance) Monitor on am labs-no aggressive control currently   Hypercholesterolemia Hold crestor for now   Time spent: 60 Full code D/w famiyl at [facility]  Keyana Guevara, [facility] HomeJAI-[facility]URMUKH [facility] Pager [phone]  If 7PM-7AM, please contact night-coverage [email or web address] Password [email or web address] 04/18/2015, 5:36 PM

## 2015-04-18 NOTE — ED Provider Notes (Signed)
Patient reassessed after CT results available (1720). She continues to c/o pain, diarrhea.  CT notable for mesenteritis versus adenitis.  With elevated cr, BUN, persistent nausea and lactic acidosis, she will continue fluid resuscitation and be admitted for further E/M.     Gerhard Munchobert Kimberlee Shoun, MD 04/18/15 1728

## 2015-04-18 NOTE — ED Provider Notes (Signed)
CSN: [id number]     Arrival date & time 04/18/15  1318 History   First MD Initiated Contact with Patient 04/18/15 1336     Chief Complaint  Patient presents with  . Abdominal Pain     (Consider location/radiation/quality/duration/timing/severity/associated sxs/prior Treatment) HPI 81 year old female presents with acute abdominal pain. Was in kitchen and felt acutely warm and was sweaty. Developed chest pain that has since moved to her abdomen, now mostly lower (but patient has difficulty localizing). Had one episode of diarrhea and has felt nauseated but no vomiting. Complains of dysuria but has a hard time telling me for exactly how long. No further chest pain, only the abdominal pain.   Past Medical History  Diagnosis Date  . HTN (hypertension)   . Hypercholesterolemia   . Glucose intolerance (impaired glucose tolerance)   . GERD (gastroesophageal reflux disease)   . Arthritis    Past Surgical History  Procedure Laterality Date  . Cholecystectomy    . Umbilical hernia repair    . Foot surgery    . Hammer toes     No family history on file. Social History  Substance Use Topics  . Smoking status: Former Games developer  . Smokeless tobacco: Never Used  . Alcohol Use: No   OB History    No data available     Review of Systems  Constitutional: Negative for fever.  Respiratory: Negative for shortness of breath.   Cardiovascular: Positive for chest pain.  Gastrointestinal: Positive for nausea, abdominal pain and diarrhea. Negative for vomiting.  Genitourinary: Positive for dysuria.  All other systems reviewed and are negative.     Allergies  Review of patient's allergies indicates no known allergies.  Home Medications   Prior to Admission medications   Medication Sig Start Date End Date Taking? Authorizing Provider  alendronate (FOSAMAX) 70 MG tablet Take 70 mg by mouth once a week.  08/18/13   Historical Provider, MD  amLODipine (NORVASC) 10 MG tablet  01/07/15    Historical Provider, MD  aspirin 81 MG tablet Take 81 mg by mouth daily.     Historical Provider, MD  Calcium Carbonate (CALCIUM 600 PO) Take by mouth 2 (two) times daily.    Historical Provider, MD  Cholecalciferol (VITAMIN D3) 1000 UNITS CAPS Take 2,000 Units by mouth daily.    Historical Provider, MD  CRESTOR 20 MG tablet TAKE 1 TABLET (20 MG TOTAL) BY MOUTH DAILY. 01/12/15   Peter M [place], MD  hydrALAZINE (APRESOLINE) 25 MG tablet TAKE 1 TABLET (25 MG TOTAL) BY MOUTH THREE TIMES DAILY. 03/12/15   Peter M [place], MD  hydrochlorothiazide (MICROZIDE) 12.5 MG capsule TAKE 1 CAPSULE (12.5 MG TOTAL) BY MOUTH DAILY. 01/12/15   Peter M [place], MD  isosorbide mononitrate (IMDUR) 30 MG 24 hr tablet TAKE 1 TABLET (30 MG TOTAL) BY MOUTH DAILY. 02/10/15   Peter M [place], MD  levothyroxine (SYNTHROID, LEVOTHROID) 50 MCG tablet Take 50 mcg by mouth daily before breakfast.    Historical Provider, MD  nitroGLYCERIN (NITROSTAT) 0.4 MG SL tablet Place 1 tablet (0.4 mg total) under the tongue every 5 (five) minutes as needed for chest pain. 09/15/13   Rosalio Macadamia, NP  omeprazole (PRILOSEC) 20 MG capsule TAKE 1 CAPSULE BY MOUTH ONCE DAILY 02/16/15   Peter M [place], MD  potassium chloride SA (K-DUR,KLOR-CON) 20 MEQ tablet Take 1 tablet (20 mEq total) by mouth 2 (two) times daily. 12/11/14   Peter M [place], MD  ramipril (ALTACE) 10 MG  capsule TAKE 1 CAPSULE (10 MG TOTAL) BY MOUTH DAILY. 12/07/14   Peter M [place], MD   BP 126/92 mmHg  Pulse 57  Resp 20  SpO2 98% Physical Exam  Constitutional: She is oriented to person, place, and time. She appears well-developed and well-nourished.  Moaning with each breath  HENT:  Head: Normocephalic and atraumatic.  Right Ear: External ear normal.  Left Ear: External ear normal.  Nose: Nose normal.  Eyes: Right eye exhibits no discharge. Left eye exhibits no discharge.  Cardiovascular: Regular rhythm and normal heart sounds.  Bradycardia present.   Pulmonary/Chest: Effort  normal and breath sounds normal. She exhibits no tenderness.  Abdominal: Soft. There is tenderness in the right upper quadrant and right lower quadrant.  Neurological: She is alert and oriented to person, place, and time.  Skin: Skin is warm and dry.  Nursing note and vitals reviewed.   ED Course  Procedures (including critical care time) Labs Review Labs Reviewed  COMPREHENSIVE METABOLIC PANEL - Abnormal; Notable for the following:    CO2 20 (*)    Glucose, Bld 128 (*)    BUN 21 (*)    Creatinine, Ser 1.57 (*)    Calcium 8.8 (*)    ALT 13 (*)    GFR calc non Af Amer 29 (*)    GFR calc Af Amer 34 (*)    All other components within normal limits  CBC WITH DIFFERENTIAL/PLATELET - Abnormal; Notable for the following:    RBC 3.51 (*)    Hemoglobin 10.6 (*)    HCT 32.1 (*)    All other components within normal limits  LIPASE, BLOOD  I-STAT TROPOININ, ED    Imaging Review Dg Chest Portable 1 View  04/18/2015  CLINICAL DATA:  Chest and abdominal pain. History of hypertension and gastroesophageal reflux. EXAM: PORTABLE CHEST 1 VIEW COMPARISON:  Chest x-ray dated 02/05/2009. FINDINGS: Cardiomegaly, moderate to severe in degree, is unchanged. There is again a mild central pulmonary vascular congestion and mild bilateral interstitial edema. Evaluation of the left lung base limited by the overlying heart shadow. No evidence of pneumonia within the remainder of the lungs. No large pleural effusion seen. No pneumothorax. No acute osseous abnormality. IMPRESSION: Stable cardiomegaly, moderate to severe in degree. Continued central pulmonary vascular congestion and mild interstitial edema suggesting a mild chronic volume overload/CHF. No new lung findings seen. Electronically Signed   By: Bary Richard M.D.   On: 04/18/2015 15:34   I have personally reviewed and evaluated these images and lab results as part of my medical decision-making.   EKG Interpretation   Date/Time:  Sunday April 18 2015 13:29:39 EDT Ventricular Rate:  63 PR Interval:  195 QRS Duration: 102 QT Interval:  426 QTC Calculation: 436 R Axis:   32 Text Interpretation:  Sinus rhythm Ventricular premature complex Abnormal  R-wave progression, early transition Nonspecific T abnormalities, lateral  leads changes noted compared to 2010 Confirmed by Ariq Khamis  MD, Kirill Chatterjee  ([id number]) on 04/18/2015 1:39:22 PM      MDM   Final diagnoses:  Right sided abdominal pain    Patient's primary complaint appears to be the right-sided abdominal pain. She did have transient chest pain although now this is gone. She has nonspecific T-wave changes that are different from 2010 but given the significant amount of tenderness without peritoneal signs I think this is primary Lane abdominal issue. Labs show some moderate dehydration, will give IV fluids. CT scan pending, care transferred to Dr. Jeraldine Loots,  dispo per the CT scan.    Pricilla LovelessScott Monita Swier, MD 04/18/15 [phone]

## 2015-04-18 NOTE — ED Notes (Signed)
Pt placed on bedpan for pt to try and have a bowel movement. Pt was unable to void. Pt remains monitored by blood pressure, pulse ox, and 12 lead. Pts family remains at bedside.

## 2015-04-18 NOTE — ED Notes (Signed)
Pt remains monitored by blood pressure, pulse ox, and 12 lead. Pts family remains at bedside.  

## 2015-04-19 LAB — CBC
HEMATOCRIT: 35 % — AB (ref 36.0–46.0)
HEMOGLOBIN: 11.9 g/dL — AB (ref 12.0–15.0)
MCH: 30.7 pg (ref 26.0–34.0)
MCHC: 34 g/dL (ref 30.0–36.0)
MCV: 90.2 fL (ref 78.0–100.0)
Platelets: 290 10*3/uL (ref 150–400)
RBC: 3.88 MIL/uL (ref 3.87–5.11)
RDW: 14.1 % (ref 11.5–15.5)
WBC: 13.3 10*3/uL — ABNORMAL HIGH (ref 4.0–10.5)

## 2015-04-19 LAB — COMPREHENSIVE METABOLIC PANEL
ALBUMIN: 4.2 g/dL (ref 3.5–5.0)
ALT: 15 U/L (ref 14–54)
ANION GAP: 10 (ref 5–15)
AST: 29 U/L (ref 15–41)
Alkaline Phosphatase: 51 U/L (ref 38–126)
BILIRUBIN TOTAL: 0.5 mg/dL (ref 0.3–1.2)
BUN: 14 mg/dL (ref 6–20)
CO2: 24 mmol/L (ref 22–32)
Calcium: 9.2 mg/dL (ref 8.9–10.3)
Chloride: 103 mmol/L (ref 101–111)
Creatinine, Ser: 1.15 mg/dL — ABNORMAL HIGH (ref 0.44–1.00)
GFR calc Af Amer: 50 mL/min — ABNORMAL LOW (ref >60)
GFR calc non Af Amer: 43 mL/min — ABNORMAL LOW (ref >60)
GLUCOSE: 126 mg/dL — AB (ref 65–99)
POTASSIUM: 3.6 mmol/L (ref 3.5–5.1)
SODIUM: 137 mmol/L (ref 135–145)
TOTAL PROTEIN: 7.1 g/dL (ref 6.5–8.1)

## 2015-04-19 LAB — C DIFFICILE QUICK SCREEN W PCR REFLEX
C DIFFICILE (CDIFF) INTERP: NEGATIVE
C DIFFICILE (CDIFF) TOXIN: NEGATIVE
C Diff antigen: NEGATIVE

## 2015-04-19 MED ORDER — ENOXAPARIN SODIUM 40 MG/0.4ML ~~LOC~~ SOLN
40.0000 mg | SUBCUTANEOUS | Status: DC
  Administered 2015-04-19 – 2015-04-21 (×3): 40 mg via SUBCUTANEOUS
  Filled 2015-04-19 (×3): qty 0.4

## 2015-04-19 NOTE — Progress Notes (Signed)
Cdiff results are negative, contact precautions removed, order discontinued. Ernestina Patchesrton, Arlett Goold D

## 2015-04-19 NOTE — Progress Notes (Signed)
Alyssa Graves:[id number] DOB: 1930/03/24 DOA: 04/18/2015 PCP: [name], MD  Brief narrative:  81 y/o ? known CAD with small non-q -wave MI in tha pst Prior CHF? [no prior echo in system] HTn Elevated cbg without diagnosis DM HLd Anemia gerd  Recently Rx ~ 1 mo ago for shingles of the nape of neck and back doing fairly well until 04/18/15 when she started to experience severe abdominal pain in the center of the stomach Associated with some nausea however no diarrhea  Diarrhea started on 10/31 again Cdiff neg Further lab work orderd  Past medical history-As per Problem list Chart reviewed as below-   Consultants:    Procedures:    Antibiotics:  none   Subjective   HOH Well no issue stol some diet but only poorly No cp   Objective    Interim History:   Telemetry:    Objective: Filed Vitals:   04/18/15 1847 04/18/15 1944 04/18/15 2000 04/19/15 0550  BP: 144/43 139/77  156/56  Pulse: 71 81  61  Temp:  98.3 F (36.8 C)  98.6 F (37 C)  TempSrc:  Oral  Oral  Resp: Height:    (1.676 m)   Weight:   92.851 kg (204 lb 11.2 oz)   SpO2: 97% 94%  96%    Intake/Output Summary (Last 24 hours) at 04/19/15 1342 Last data filed at 04/19/15 1035  Gross per 24 hour  Intake    770 ml  Output    300 ml  Net    470 ml    Exam:  General: eomi ncat Cardiovascular: s1s2 no m/r/g Respiratory: clear no added soudn Abdomen: soft distedned slightly no rebound no gaurd Skin no le edema Neuro intact  Data Reviewed: Basic Metabolic Panel:  Recent Labs Lab 04/18/15 1400 04/19/15 0436  NA 137 137  K 3.9 3.6  CL 105 103  CO2 20* 24  GLUCOSE 128* 126*  BUN 21* 14  CREATININE 1.57* 1.15*  CALCIUM 8.8* 9.2   Liver Function Tests:  Recent Labs Lab 04/18/15 1400 04/19/15 0436  AST 23 29  ALT 13* 15  ALKPHOS 50 51  BILITOT 0.4 0.5  PROT 6.7 7.1  ALBUMIN 4.1 4.2    Recent Labs Lab 04/18/15 1400  LIPASE 31    No results for input(s): AMMONIA in the last 168 hours. CBC:  Recent Labs Lab 04/18/15 1400 04/19/15 0436  WBC 9.0 13.3*  NEUTROABS 7.4  --   HGB 10.6* 11.9*  HCT 32.1* 35.0*  MCV 91.5 90.2  PLT 239 290   Cardiac Enzymes: No results for input(s): CKTOTAL, CKMB, CKMBINDEX, TROPONINI in the last 168 hours. BNP: Invalid input(s): POCBNP CBG: No results for input(s): GLUCAP in the last 168 hours.  Recent Results (from the past 240 hour(s))  C difficile quick scan w PCR reflex     Status: None   Collection Time: 04/19/15  5:08 AM  Result Value Ref Range Status   C Diff antigen NEGATIVE NEGATIVE Final   C Diff toxin NEGATIVE NEGATIVE Final   C Diff interpretation Negative for toxigenic C. difficile  Final     Studies:              All Imaging reviewed and is as per above notation   Scheduled Meds: . amLODipine  10 mg Oral QHS  . aspirin EC  81 mg Oral Daily  . enoxaparin (LOVENOX) injection  40 mg Subcutaneous Q24H  .  hydrALAZINE  25 mg Oral 3 times per day  . isosorbide mononitrate  30 mg Oral Daily  . levothyroxine  50 mcg Oral QAC breakfast  . pantoprazole  40 mg Oral Daily  . sodium chloride  3 mL Intravenous Q12H   Continuous Infusions: . sodium chloride       Assessment/Plan:   1. Diarrheal illness/mesenteric adenitis-Saline lock IV. Grad diet.  Would hold on Imodium today but if stool non-bloody will consider starting in am.  Get fecal lactoferrin, Shiga e.coli 157 testing as slight increase in WBC and may be infectious etiology.  Keep Plexiseal in but may be able to withdraw if hardening of stool.  OOB and ambulate.  Up with therapy 2. Prior CAD-for now ok to continue asa 81, Imdur 30 daily, Amlodipine 10 daily, hydralazine 25 q8 as is maintaining bp.  D/c tele-hold diuretics for now 3. AKI-creat on admit 1.57-->now 1.1.  saline lock IV.  Force PO fluids. 4. Elevated CBG's-monitor on am labs 5. Hld hold attain for now-resume on d/c home 6. Anemia-stable.   Monitor trends  Code Status: full Family Communication:  D/w daughter who is at bedsdie Disposition Plan: inpatient    Pleas KochJai Tannisha Kennington, MD  [facility] Pager [phone] 04/19/2015, 1:42 PM    LOS: 1 day

## 2015-04-19 NOTE — Progress Notes (Addendum)
Pt having frequent liquid BMs contaminated by urine. Breakdown beginning to be noted in peri area. Pt on contact precautions for r/o Cdiff. Unable to get sample because of liquid stool and Pt incont of urine. Kirtland BouchardK Schorr NP notified and Flexiseal order received.

## 2015-04-20 LAB — CBC WITH DIFFERENTIAL/PLATELET
BASOS PCT: 0 %
Basophils Absolute: 0 10*3/uL (ref 0.0–0.1)
EOS PCT: 1 %
Eosinophils Absolute: 0.1 10*3/uL (ref 0.0–0.7)
HCT: 30.9 % — ABNORMAL LOW (ref 36.0–46.0)
HEMOGLOBIN: 10.3 g/dL — AB (ref 12.0–15.0)
Lymphocytes Relative: 12 %
Lymphs Abs: 0.9 10*3/uL (ref 0.7–4.0)
MCH: 30.1 pg (ref 26.0–34.0)
MCHC: 33.3 g/dL (ref 30.0–36.0)
MCV: 90.4 fL (ref 78.0–100.0)
Monocytes Absolute: 0.7 10*3/uL (ref 0.1–1.0)
Monocytes Relative: 8 %
NEUTROS PCT: 79 %
Neutro Abs: 6.5 10*3/uL (ref 1.7–7.7)
PLATELETS: 219 10*3/uL (ref 150–400)
RBC: 3.42 MIL/uL — AB (ref 3.87–5.11)
RDW: 14.2 % (ref 11.5–15.5)
WBC: 8.2 10*3/uL (ref 4.0–10.5)

## 2015-04-20 NOTE — Progress Notes (Signed)
Alyssa Graves:[id number] DOB: [birth year] DOA: 04/18/2015 PCP: [name], MD  Brief narrative:  81 y/o ? known CAD with small non-q -wave MI in tha pst Prior CHF? [no prior echo in system] HTn Elevated cbg without diagnosis DM HLd Anemia gerd  Recently Rx ~ 1 mo ago for shingles of the nape of neck and back doing fairly well until 04/18/15 when she started to experience severe abdominal pain in the center of the stomach Associated with some nausea however no diarrhea  Diarrhea started on 10/31 again Cdiff neg Further lab work orderd  Past medical history-As per Problem list Chart reviewed as below-   Consultants:    Procedures:    Antibiotics:  none   Subjective   Well no further stool apparently i tube Labs not collected as no large amounts of stool tol diet No cp/f/chlls   Objective    Interim History:   Telemetry:    Objective: Filed Vitals:   04/19/15 1351 04/19/15 2246 04/20/15 0538 04/20/15 1324  BP: 124/51 137/54 155/76 129/50  Pulse: 69 74 76   Temp: 98.6 F (37 C) 98.6 F (37 C) 99.5 F (37.5 C)   TempSrc: Oral Oral Oral   Resp: Height:      Weight:      SpO2: 94% 96% 94%     Intake/Output Summary (Last 24 hours) at 04/20/15 1347 Last data filed at 04/19/15 1350  Gross per 24 hour  Intake    240 ml  Output      0 ml  Net    240 ml    Exam:  General: eomi ncat Cardiovascular: s1s2 no m/r/g Respiratory: clear no added soudn Abdomen: soft distedned no rebound no guard no tender Skin no le edema Neuro intact  Data Reviewed: Basic Metabolic Panel:  Recent Labs Lab 04/18/15 1400 04/19/15 0436  NA 137 137  K 3.9 3.6  CL 105 103  CO2 20* 24  GLUCOSE 128* 126*  BUN 21* 14  CREATININE 1.57* 1.15*  CALCIUM 8.8* 9.2   Liver Function Tests:  Recent Labs Lab 04/18/15 1400 04/19/15 0436  AST 23 29  ALT 13* 15  ALKPHOS 50 51  BILITOT 0.4 0.5  PROT 6.7 7.1  ALBUMIN 4.1 4.2    Recent  Labs Lab 04/18/15 1400  LIPASE 31   No results for input(s): AMMONIA in the last 168 hours. CBC:  Recent Labs Lab 04/18/15 1400 04/19/15 0436 04/20/15 0815  WBC 9.0 13.3* 8.2  NEUTROABS 7.4  --  6.5  HGB 10.6* 11.9* 10.3*  HCT 32.1* 35.0* 30.9*  MCV 91.5 90.2 90.4  PLT 239 290 219   Cardiac Enzymes: No results for input(s): CKTOTAL, CKMB, CKMBINDEX, TROPONINI in the last 168 hours. BNP: Invalid input(s): POCBNP CBG: No results for input(s): GLUCAP in the last 168 hours.  Recent Results (from the past 240 hour(s))  C difficile quick scan w PCR reflex     Status: None   Collection Time: 04/19/15  5:08 AM  Result Value Ref Range Status   C Diff antigen NEGATIVE NEGATIVE Final   C Diff toxin NEGATIVE NEGATIVE Final   C Diff interpretation Negative for toxigenic C. difficile  Final     Studies:              All Imaging reviewed and is as per above notation   Scheduled Meds: . amLODipine  10 mg Oral QHS  . aspirin EC  81  mg Oral Daily  . enoxaparin (LOVENOX) injection  40 mg Subcutaneous Q24H  . hydrALAZINE  25 mg Oral 3 times per day  . isosorbide mononitrate  30 mg Oral Daily  . levothyroxine  50 mcg Oral QAC breakfast  . pantoprazole  40 mg Oral Daily  . sodium chloride  3 mL Intravenous Q12H   Continuous Infusions: . sodium chloride       Assessment/Plan:   1. Diarrheal illness/mesenteric adenitis-Saline lock IV. Grad diet. Start imodium today if diarrhea-PRN order placed.  Pending are fecal lactoferrin, Shiga e.coli 157.  D/c Plexiseal .  OOB and ambulate.  Up with therapy 2. Prior CAD-for now ok to continue asa 81, Imdur 30 daily, Amlodipine 10 daily, hydralazine 25 q8 as is maintaining bp.  D/c tele-hold diuretics for now 3. AKI-creat on admit 1.57-->now 1.1. Rpt labs in am. saline locked IV 10/31.  Force PO fluids. 4. Elevated CBG's-monitor on am labs 5. Hld hold attain for now-resume on d/c home 6. Anemia-stable.  Monitor trends  Code Status:  full Family Communication:  D/w husband at bedsdie Disposition Plan: inpatient    Pleas KochJai Cleone Hulick, MD  [facility] Pager [phone] 04/20/2015, 1:47 PM    LOS: 2 days

## 2015-04-21 ENCOUNTER — Ambulatory Visit: Payer: Medicare Other | Admitting: Podiatry

## 2015-04-21 DIAGNOSIS — I2510 Atherosclerotic heart disease of native coronary artery without angina pectoris: Secondary | ICD-10-CM

## 2015-04-21 DIAGNOSIS — R197 Diarrhea, unspecified: Secondary | ICD-10-CM

## 2015-04-21 DIAGNOSIS — R1013 Epigastric pain: Secondary | ICD-10-CM

## 2015-04-21 DIAGNOSIS — D649 Anemia, unspecified: Secondary | ICD-10-CM

## 2015-04-21 LAB — BASIC METABOLIC PANEL
Anion gap: 8 (ref 5–15)
BUN: 8 mg/dL (ref 6–20)
CALCIUM: 8.5 mg/dL — AB (ref 8.9–10.3)
CHLORIDE: 106 mmol/L (ref 101–111)
CO2: 23 mmol/L (ref 22–32)
CREATININE: 1.03 mg/dL — AB (ref 0.44–1.00)
GFR calc non Af Amer: 49 mL/min — ABNORMAL LOW (ref >60)
GFR, EST AFRICAN AMERICAN: 57 mL/min — AB (ref >60)
Glucose, Bld: 133 mg/dL — ABNORMAL HIGH (ref 65–99)
Potassium: 3.5 mmol/L (ref 3.5–5.1)
SODIUM: 137 mmol/L (ref 135–145)

## 2015-04-21 LAB — FECAL LACTOFERRIN, QUANT: Fecal Lactoferrin: POSITIVE

## 2015-04-21 NOTE — Care Management Important Message (Signed)
Important Message  Patient Details  Name: Alyssa Graves MRN: [id number] Date of Birth: 1929/07/13   Medicare Important Message Given:  Yes-second notification given    Kyla BalzarineShealy, Jaevon Paras Abena 04/21/2015, 11:46 AM

## 2015-04-21 NOTE — Progress Notes (Signed)
[facility] PROGRESS NOTE  Alyssa Graves WUJ:[id number] DOB: 01-26-1930 DOA: 04/18/2015  PCP: [name], MD  Brief HPI: 81 year old Caucasian female with a past medical history of coronary artery disease, hypertension, anemia, GERD, who was treated for herpes zoster about a month ago and was doing fairly well until the day prior to admission when she started experiencing severe central abdominal pain. Subsequently started having diarrhea.  Past medical history:  Past Medical History  Diagnosis Date  . HTN (hypertension)   . Hypercholesterolemia   . Glucose intolerance (impaired glucose tolerance)   . GERD (gastroesophageal reflux disease)   . Arthritis     Consultants: None  Procedures: None  Antibiotics: None  Subjective: Denies any abdominal pain, nausea or vomiting. No further episodes of diarrhea. Does complain of frequent urination. Denies any falls or injuries. No back pain.  Objective: Vital Signs  Filed Vitals:   04/20/15 1324 04/20/15 1446 04/20/15 2244 04/21/15 0539  BP: 129/50 139/53 144/46 137/60  Pulse:  76 66 67  Temp:  99.6 F (37.6 C) 99 F (37.2 C) 98.8 F (37.1 C)  TempSrc:  Oral Oral Oral  Resp:  Height:      Weight:      SpO2:  92% 93% 93%    Intake/Output Summary (Last 24 hours) at 04/21/15 1351 Last data filed at 04/21/15 0957  Gross per 24 hour  Intake    820 ml  Output      0 ml  Net    820 ml   Filed Weights   04/18/15 2000  Weight: 92.851 kg (204 lb 11.2 oz)    General appearance: alert, cooperative, appears stated age and no distress Resp: clear to auscultation bilaterally Cardio: regular rate and rhythm, S1, S2 normal, no murmur, click, rub or gallop GI: soft, non-tender; bowel sounds normal; no masses,  no organomegaly Extremities: extremities normal, atraumatic, no cyanosis or edema Neurologic: Alert and awake. No cranial nerve deficits. Strength equal bilaterally upper and Lower  extremities.  Lab Results:  Basic Metabolic Panel:  Recent Labs Lab 04/18/15 1400 04/19/15 0436 04/21/15 1113  NA 137 137 137  K 3.9 3.6 3.5  CL 105 103 106  CO2 20* 24 23  GLUCOSE 128* 126* 133*  BUN 21* 14 8  CREATININE 1.57* 1.15* 1.03*  CALCIUM 8.8* 9.2 8.5*   Liver Function Tests:  Recent Labs Lab 04/18/15 1400 04/19/15 0436  AST 23 29  ALT 13* 15  ALKPHOS 50 51  BILITOT 0.4 0.5  PROT 6.7 7.1  ALBUMIN 4.1 4.2    Recent Labs Lab 04/18/15 1400  LIPASE 31   CBC:  Recent Labs Lab 04/18/15 1400 04/19/15 0436 04/20/15 0815  WBC 9.0 13.3* 8.2  NEUTROABS 7.4  --  6.5  HGB 10.6* 11.9* 10.3*  HCT 32.1* 35.0* 30.9*  MCV 91.5 90.2 90.4  PLT 239 290 219     Recent Results (from the past 240 hour(s))  C difficile quick scan w PCR reflex     Status: None   Collection Time: 04/19/15  5:08 AM  Result Value Ref Range Status   C Diff antigen NEGATIVE NEGATIVE Final   C Diff toxin NEGATIVE NEGATIVE Final   C Diff interpretation Negative for toxigenic C. difficile  Final      Studies/Results: No results found.  Medications:  Scheduled: . amLODipine  10 mg Oral QHS  . aspirin EC  81 mg Oral Daily  . enoxaparin (LOVENOX) injection  40 mg Subcutaneous Q24H  . hydrALAZINE  25 mg Oral 3 times per day  . isosorbide mononitrate  30 mg Oral Daily  . levothyroxine  50 mcg Oral QAC breakfast  . pantoprazole  40 mg Oral Daily  . sodium chloride  3 mL Intravenous Q12H   Continuous: . sodium chloride 30 mL/hr at 04/21/15 1058   ZOX:WRUEPRN:alum & mag hydroxide-simeth, gabapentin, nitroGLYCERIN, ondansetron **OR** ondansetron (ZOFRAN) IV, oxyCODONE  Assessment/Plan:  Principal Problem:   Abdominal pain, acute Active Problems:   ANEMIA, NORMOCYTIC, CHRONIC   Essential hypertension   Coronary atherosclerosis   Chest pain   HTN (hypertension)   Glucose intolerance (impaired glucose tolerance)   Hypercholesterolemia   Abdominal pain, epigastric    Abdominal  pain with diarrhea CT scan revealed mild mesenteric adenitis. Abdomen is completely benign today. She could've had mild gastroenteritis. Stool was negative for C. difficile. Diarrhea appears to have subsided. Imodium as needed.  Frequent urination UA has been ordered. Could have a UTI.  Prior history of coronary artery disease Stable. Continue medical treatment.  Mild acute kidney injury Creatinine was slightly elevated at admission. Improved with IV fluids.  History of hypothyroidism Continue Synthroid.  Normocytic anemia Stable hemoglobin  DVT Prophylaxis: Lovenox    Code Status: Full code  Family Communication: Discussed with the patient  Disposition Plan: Await UA. PT and OT to evaluate.    LOS: 3 days   [facility] Medical CenterKRISHNAN,Aqil Goetting  [facility] Pager [phone] 04/21/2015, 1:51 PM  If 7PM-7AM, please contact night-coverage at [email or web address], password [email or web address]

## 2015-04-21 NOTE — Evaluation (Signed)
Physical Therapy Evaluation Patient Details Name: Alyssa Graves MRN: [id number] DOB: Sep 08, 1929 Today's Date: 04/21/2015   History of Present Illness  Pt adm with abd pain and diarrhea. PMH - CAD, HTN, who was treated for herpes zoster about a month ago   Clinical Impression  Pt admitted with above diagnosis and presents to PT with functional limitations due to deficits listed below (See PT problem list). Pt needs skilled PT to maximize independence and safety to allow discharge to home with family and HHPT. If family unable to provide some initial supervision/assist for mobility may need to look at alternatives.     Follow Up Recommendations Home health PT;Supervision for mobility/OOB    Equipment Recommendations  None recommended by PT    Recommendations for Other Services       Precautions / Restrictions Precautions Precautions: Fall      Mobility  Bed Mobility Overal bed mobility: Needs Assistance Bed Mobility: Supine to Sit     Supine to sit: Supervision;HOB elevated     General bed mobility comments: Incr time and effort  Transfers Overall transfer level: Needs assistance Equipment used: Rolling walker (2 wheeled);None Transfers: Sit to/from [employer] to Stand: Min assist Stand pivot transfers: Min assist       General transfer comment: Assist to bring hips up for balance. Bed to [facility] without assistive device  Ambulation/Gait Ambulation/Gait assistance: Min guard Ambulation Distance (Feet): 45 Feet Assistive device: Rolling walker (2 wheeled) Gait Pattern/deviations: Step-through pattern;Decreased step length - right;Decreased step length - left;Shuffle;Trunk flexed Gait velocity: decr Gait velocity interpretation: Below normal speed for age/gender General Gait Details: Verbal cues to stand more erect  Stairs            Wheelchair Mobility    Modified Rankin (Stroke Patients Only)       Balance Overall balance  assessment: Needs assistance Sitting-balance support: No upper extremity supported;Feet supported Sitting balance-Leahy Scale: Good     Standing balance support: Bilateral upper extremity supported Standing balance-Leahy Scale: Poor Standing balance comment: support of walker and supervision for static standing.                             Pertinent Vitals/Pain Pain Assessment: No/denies pain    Home Living Family/patient expects to be discharged to:: Private residence Living Arrangements: Spouse/significant other Available Help at Discharge: Family;Available 24 hours/day (daughter lives nearby per pt) Type of Home: House Home Access: Level entry     Home Layout: One level Home Equipment: Environmental consultant - 2 wheels      Prior Function Level of Independence: Independent               Hand Dominance        Extremity/Trunk Assessment   Upper Extremity Assessment: Defer to OT evaluation           Lower Extremity Assessment: Generalized weakness         Communication   Communication: HOH  Cognition Arousal/Alertness: Awake/alert Behavior During Therapy: WFL for tasks assessed/performed Overall Cognitive Status: No family/caregiver present to determine baseline cognitive functioning Area of Impairment: Problem solving             Problem Solving: Slow processing      General Comments      Exercises        Assessment/Plan    PT Assessment Patient needs continued PT services  PT Diagnosis Difficulty walking;Generalized weakness   PT  Problem List Decreased strength;Decreased activity tolerance;Decreased balance;Decreased mobility;Decreased knowledge of use of DME;Obesity  PT Treatment Interventions DME instruction;Gait training;Functional mobility training;Therapeutic activities;Therapeutic exercise;Balance training;Patient/family education   PT Goals (Current goals can be found in the Care Plan section) Acute Rehab PT Goals Patient Stated  Goal: Return home PT Goal Formulation: With patient Time For Goal Achievement: 04/28/15 Potential to Achieve Goals: Good    Frequency Min 3X/week   Barriers to discharge        Co-evaluation               End of Session Equipment Utilized During Treatment: Gait belt Activity Tolerance: Patient limited by fatigue Patient left: in chair;with call bell/phone within reach (no chair alarm available) Nurse Communication: Mobility status         Time: [id number] PT Time Calculation (min) (ACUTE ONLY): 23 min   Charges:   PT Evaluation $Initial PT Evaluation Tier I: 1 Procedure     PT G Codes:        Alyssa Graves 04/21/2015, 2:04 PM [facility] [phone]

## 2015-04-22 DIAGNOSIS — K529 Noninfective gastroenteritis and colitis, unspecified: Principal | ICD-10-CM

## 2015-04-22 LAB — URINE MICROSCOPIC-ADD ON

## 2015-04-22 LAB — URINALYSIS, ROUTINE W REFLEX MICROSCOPIC
Bilirubin Urine: NEGATIVE
GLUCOSE, UA: NEGATIVE mg/dL
HGB URINE DIPSTICK: NEGATIVE
KETONES UR: NEGATIVE mg/dL
Nitrite: NEGATIVE
PH: 5.5 (ref 5.0–8.0)
Protein, ur: NEGATIVE mg/dL
Specific Gravity, Urine: 1.011 (ref 1.005–1.030)
Urobilinogen, UA: 1 mg/dL (ref 0.0–1.0)

## 2015-04-22 LAB — BASIC METABOLIC PANEL
Anion gap: 7 (ref 5–15)
BUN: 12 mg/dL (ref 6–20)
CALCIUM: 8.8 mg/dL — AB (ref 8.9–10.3)
CO2: 25 mmol/L (ref 22–32)
CREATININE: 1 mg/dL (ref 0.44–1.00)
Chloride: 106 mmol/L (ref 101–111)
GFR calc Af Amer: 59 mL/min — ABNORMAL LOW (ref >60)
GFR, EST NON AFRICAN AMERICAN: 51 mL/min — AB (ref >60)
GLUCOSE: 97 mg/dL (ref 65–99)
POTASSIUM: 3.2 mmol/L — AB (ref 3.5–5.1)
Sodium: 138 mmol/L (ref 135–145)

## 2015-04-22 LAB — CBC
HCT: 28.9 % — ABNORMAL LOW (ref 36.0–46.0)
Hemoglobin: 9.8 g/dL — ABNORMAL LOW (ref 12.0–15.0)
MCH: 30.8 pg (ref 26.0–34.0)
MCHC: 33.9 g/dL (ref 30.0–36.0)
MCV: 90.9 fL (ref 78.0–100.0)
PLATELETS: 217 10*3/uL (ref 150–400)
RBC: 3.18 MIL/uL — ABNORMAL LOW (ref 3.87–5.11)
RDW: 14.1 % (ref 11.5–15.5)
WBC: 5.3 10*3/uL (ref 4.0–10.5)

## 2015-04-22 MED ORDER — CEPHALEXIN 500 MG PO CAPS: 500.0000 mg | ORAL_CAPSULE | Freq: Three times a day (TID) | ORAL | Status: DC

## 2015-04-22 MED ORDER — POTASSIUM CHLORIDE CRYS ER 20 MEQ PO TBCR
40.0000 meq | EXTENDED_RELEASE_TABLET | Freq: Once | ORAL | Status: AC
  Administered 2015-04-22: 40 meq via ORAL
  Filled 2015-04-22: qty 2

## 2015-04-22 MED ORDER — CEPHALEXIN 500 MG PO CAPS
500.0000 mg | ORAL_CAPSULE | Freq: Three times a day (TID) | ORAL | Status: DC
  Administered 2015-04-22: 500 mg via ORAL
  Filled 2015-04-22: qty 1

## 2015-04-22 NOTE — Discharge Instructions (Signed)
Urinary Tract Infection Urinary tract infections (UTIs) can develop anywhere along your urinary tract. Your urinary tract is your body's drainage system for removing wastes and extra water. Your urinary tract includes two kidneys, two ureters, a bladder, and a urethra. Your kidneys are a pair of bean-shaped organs. Each kidney is about the size of your fist. They are located below your ribs, one on each side of your spine. CAUSES Infections are caused by microbes, which are microscopic organisms, including fungi, viruses, and bacteria. These organisms are so small that they can only be seen through a microscope. Bacteria are the microbes that most commonly cause UTIs. SYMPTOMS  Symptoms of UTIs may vary by age and gender of the patient and by the location of the infection. Symptoms in young women typically include a frequent and intense urge to urinate and a painful, burning feeling in the bladder or urethra during urination. Older women and men are more likely to be tired, shaky, and weak and have muscle aches and abdominal pain. A fever may mean the infection is in your kidneys. Other symptoms of a kidney infection include pain in your back or sides below the ribs, nausea, and vomiting. DIAGNOSIS To diagnose a UTI, your caregiver will ask you about your symptoms. Your caregiver will also ask you to provide a urine sample. The urine sample will be tested for bacteria and white blood cells. White blood cells are made by your body to help fight infection. TREATMENT  Typically, UTIs can be treated with medication. Because most UTIs are caused by a bacterial infection, they usually can be treated with the use of antibiotics. The choice of antibiotic and length of treatment depend on your symptoms and the type of bacteria causing your infection. HOME CARE INSTRUCTIONS  If you were prescribed antibiotics, take them exactly as your caregiver instructs you. Finish the medication even if you feel better after  you have only taken some of the medication.  Drink enough water and fluids to keep your urine clear or pale yellow.  Avoid caffeine, tea, and carbonated beverages. They tend to irritate your bladder.  Empty your bladder often. Avoid holding urine for long periods of time.  Empty your bladder before and after sexual intercourse.  After a bowel movement, women should cleanse from front to back. Use each tissue only once. SEEK MEDICAL CARE IF:   You have back pain.  You develop a fever.  Your symptoms do not begin to resolve within 3 days. SEEK IMMEDIATE MEDICAL CARE IF:   You have severe back pain or lower abdominal pain.  You develop chills.  You have nausea or vomiting.  You have continued burning or discomfort with urination. MAKE SURE YOU:   Understand these instructions.  Will watch your condition.  Will get help right away if you are not doing well or get worse.   This information is not intended to replace advice given to you by your health care provider. Make sure you discuss any questions you have with your health care provider.   Document Released: 03/15/2005 Document Revised: 02/24/2015 Document Reviewed: 07/14/2011 Elsevier Interactive Patient Education 2016 [employer]   Viral Gastroenteritis Viral gastroenteritis is also known as stomach flu. This condition affects the stomach and intestinal tract. It can cause sudden diarrhea and vomiting. The illness typically lasts 3 to 8 days. Most people develop an immune response that eventually gets rid of the virus. While this natural response develops, the virus can make you quite ill. CAUSES  Many different viruses can cause gastroenteritis, such as rotavirus or noroviruses. You can catch one of these viruses by consuming contaminated food or water. You may also catch a virus by sharing utensils or other personal items with an infected person or by touching a contaminated surface. SYMPTOMS  The most common  symptoms are diarrhea and vomiting. These problems can cause a severe loss of body fluids (dehydration) and a body salt (electrolyte) imbalance. Other symptoms may include:  Fever.  Headache.  Fatigue.  Abdominal pain. DIAGNOSIS  Your caregiver can usually diagnose viral gastroenteritis based on your symptoms and a physical exam. A stool sample may also be taken to test for the presence of viruses or other infections. TREATMENT  This illness typically goes away on its own. Treatments are aimed at rehydration. The most serious cases of viral gastroenteritis involve vomiting so severely that you are not able to keep fluids down. In these cases, fluids must be given through an intravenous line (IV). HOME CARE INSTRUCTIONS   Drink enough fluids to keep your urine clear or pale yellow. Drink small amounts of fluids frequently and increase the amounts as tolerated.  Ask your caregiver for specific rehydration instructions.  Avoid:  Foods high in sugar.  Alcohol.  Carbonated drinks.  Tobacco.  Juice.  Caffeine drinks.  Extremely hot or cold fluids.  Fatty, greasy foods.  Too much intake of anything at one time.  Dairy products until 24 to 48 hours after diarrhea stops.  You may consume probiotics. Probiotics are active cultures of beneficial bacteria. They may lessen the amount and number of diarrheal stools in adults. Probiotics can be found in yogurt with active cultures and in supplements.  Wash your hands well to avoid spreading the virus.  Only take over-the-counter or prescription medicines for pain, discomfort, or fever as directed by your caregiver. Do not give aspirin to children. Antidiarrheal medicines are not recommended.  Ask your caregiver if you should continue to take your regular prescribed and over-the-counter medicines.  Keep all follow-up appointments as directed by your caregiver. SEEK IMMEDIATE MEDICAL CARE IF:   You are unable to keep fluids  down.  You do not urinate at least once every 6 to 8 hours.  You develop shortness of breath.  You notice blood in your stool or vomit. This may look like coffee grounds.  You have abdominal pain that increases or is concentrated in one small area (localized).  You have persistent vomiting or diarrhea.  You have a fever.  The patient is a child younger than 3 months, and he or she has a fever.  The patient is a child older than 3 months, and he or she has a fever and persistent symptoms.  The patient is a child older than 3 months, and he or she has a fever and symptoms suddenly get worse.  The patient is a baby, and he or she has no tears when crying. MAKE SURE YOU:   Understand these instructions.  Will watch your condition.  Will get help right away if you are not doing well or get worse.   This information is not intended to replace advice given to you by your health care provider. Make sure you discuss any questions you have with your health care provider.   Document Released: 06/05/2005 Document Revised: 08/28/2011 Document Reviewed: 03/22/2011 Elsevier Interactive Patient Education [employer]

## 2015-04-22 NOTE — Discharge Summary (Signed)
[facility]  Physician Discharge Summary   Patient ID: Alyssa Graves MRN: [id number] DOB/AGE: [birth year] 81 y.o.  Admit date: 04/18/2015 Discharge date: 04/22/2015  PCP: [name], MD  DISCHARGE DIAGNOSES:  Principal Problem:   Abdominal pain, acute Active Problems:   ANEMIA, NORMOCYTIC, CHRONIC   Essential hypertension   Coronary atherosclerosis   Chest pain   HTN (hypertension)   Glucose intolerance (impaired glucose tolerance)   Hypercholesterolemia   Abdominal pain, epigastric   RECOMMENDATIONS FOR OUTPATIENT FOLLOW UP: 1. Needs close follow-up with her PCP. 2. Home health has been ordered   DISCHARGE CONDITION: fair  Diet recommendation: Heart healthy  Filed Weights   04/18/15 2000  Weight: 92.851 kg (204 lb 11.2 oz)    INITIAL HISTORY: 81 year old Caucasian female with a past medical history of coronary artery disease, hypertension, anemia, GERD, who was treated for herpes zoster about a month ago and was doing fairly well until the day prior to admission when she started experiencing severe central abdominal pain. Subsequently started having diarrhea.   HOSPITAL COURSE:   Abdominal pain with diarrhea CT scan revealed mild mesenteric adenitis. Patient's symptoms slowly improved. Abdomen remains benign. It's likely she had a case of mild gastroenteritis. Vasculopathy was noted on CT however, symptoms were not suggest of ischemia. Stool was negative for C. difficile. Diarrhea appears to have subsided. She is tolerating her diet.  Frequent urination UA was ordered and was noted to be abnormal. It is possible that she could've had a UTI. We will treat with Keflex. She will need to follow-up with her primary care physician. Some of her symptoms are also suggestive of stress incontinence.  Prior history of coronary artery disease Stable. Continue medical treatment.  Mild acute kidney injury Creatinine was slightly elevated at admission.  Improved with IV fluids.  History of hypothyroidism Continue Synthroid.  Normocytic anemia Stable hemoglobin  Overall, stable. Seen by physical therapy. Home health will be arranged. Okay for discharge today.  PERTINENT LABS:  The results of significant diagnostics from this hospitalization (including imaging, microbiology, ancillary and laboratory) are listed below for reference.    Microbiology: Recent Results (from the past 240 hour(s))  C difficile quick scan w PCR reflex     Status: None   Collection Time: 04/19/15  5:08 AM  Result Value Ref Range Status   C Diff antigen NEGATIVE NEGATIVE Final   C Diff toxin NEGATIVE NEGATIVE Final   C Diff interpretation Negative for toxigenic C. difficile  Final  Stool culture     Status: None (Preliminary result)   Collection Time: 04/20/15  1:42 PM  Result Value Ref Range Status   Specimen Description STOOL  Final   Special Requests Immunocompromised  Final   Culture   Final    NO SUSPICIOUS COLONIES, CONTINUING TO HOLD Performed at [facility]    Report Status PENDING  Incomplete     Labs: Basic Metabolic Panel:  Recent Labs Lab 04/18/15 1400 04/19/15 0436 04/21/15 1113 04/22/15 0635  NA 137 137 137 138  K 3.9 3.6 3.5 3.2*  CL 105 103 106 106  CO2 20* GLUCOSE 128* 126* 133* 97  BUN 21* CREATININE 1.57* 1.15* 1.03* 1.00  CALCIUM 8.8* 9.2 8.5* 8.8*   Liver Function Tests:  Recent Labs Lab 04/18/15 1400 04/19/15 0436  AST 23 29  ALT 13* 15  ALKPHOS 50 51  BILITOT 0.4 0.5  PROT 6.7 7.1  ALBUMIN 4.1 4.2  Recent Labs Lab 04/18/15 1400  LIPASE 31   CBC:  Recent Labs Lab 04/18/15 1400 04/19/15 0436 04/20/15 0815 04/22/15 0635  WBC 9.0 13.3* 8.2 5.3  NEUTROABS 7.4  --  6.5  --   HGB 10.6* 11.9* 10.3* 9.8*  HCT 32.1* 35.0* 30.9* 28.9*  MCV 91.5 90.2 90.4 90.9  PLT 239 290 219 217     IMAGING STUDIES Ct Abdomen Pelvis Wo Contrast  04/18/2015  ADDENDUM REPORT:  04/18/2015 18:21 ADDENDUM: After further review, a small pericardial effusion is noted at the heart base. Electronically Signed   By: Bary Richard M.D.   On: 04/18/2015 18:21  04/18/2015  CLINICAL DATA:  Acute abdominal pain. Diarrhea and nausea. Dysuria. EXAM: CT ABDOMEN AND PELVIS WITHOUT CONTRAST TECHNIQUE: Multidetector CT imaging of the abdomen and pelvis was performed following the standard protocol without IV contrast. COMPARISON:  None. FINDINGS: Lower chest:  No acute findings. Hepatobiliary: Status post cholecystectomy.  Liver appears normal. Pancreas: No mass or inflammatory process identified on this un-enhanced exam. Spleen: Within normal limits in size. Adrenals/Urinary Tract: Kidneys are unremarkable without stone or hydronephrosis. No ureteral or bladder calculi identified. Bladder appears normal. No bladder wall thickening. Stomach/Bowel: Fairly extensive diverticulosis throughout the descending and sigmoid colon but no inflammatory change to suggest acute diverticulitis. No dilated large or small bowel loops. No evidence of bowel wall inflammation. Vascular/Lymphatic: Prominent atherosclerotic changes of the normal-caliber abdominal aorta and branch vessels. No enlarged lymph nodes. Subtle edema-like change noted within the left abdominal mesentery with clustered small lymph nodes suggesting mild mesenteritis or mesenteric adenitis. Reproductive: Unremarkable. Other: No free fluid or abscess collections seen. No free intraperitoneal air. Musculoskeletal: Degenerative changes throughout the thoracolumbar spine but no acute osseous abnormality. IMPRESSION: 1. Extensive colonic diverticulosis, predominantly involving the descending colon and sigmoid colon, but NO evidence of acute diverticulitis. 2. Mild edema-like change within the left abdominal mesentery, with associated small clustered lymph nodes, suggesting a mild mesenteritis or mesenteric adenitis. 3. Extensive vasculopathy. 4. Remainder of  the abdomen and pelvis CT is unremarkable. No free fluid or abscess. No free intraperitoneal air. No bowel obstruction. No evidence of acute solid organ abnormality. No renal or ureteral calculi. No evidence of pyelonephritis or cystitis. Electronically Signed: By: Bary Richard M.D. On: 04/18/2015 17:02   Dg Chest Portable 1 View  04/18/2015  CLINICAL DATA:  Chest and abdominal pain. History of hypertension and gastroesophageal reflux. EXAM: PORTABLE CHEST 1 VIEW COMPARISON:  Chest x-ray dated 02/05/2009. FINDINGS: Cardiomegaly, moderate to severe in degree, is unchanged. There is again a mild central pulmonary vascular congestion and mild bilateral interstitial edema. Evaluation of the left lung base limited by the overlying heart shadow. No evidence of pneumonia within the remainder of the lungs. No large pleural effusion seen. No pneumothorax. No acute osseous abnormality. IMPRESSION: Stable cardiomegaly, moderate to severe in degree. Continued central pulmonary vascular congestion and mild interstitial edema suggesting a mild chronic volume overload/CHF. No new lung findings seen. Electronically Signed   By: Bary Richard M.D.   On: 04/18/2015 15:34    DISCHARGE EXAMINATION: Filed Vitals:   04/21/15 1433 04/21/15 2120 04/22/15 0537 04/22/15 1236  BP: 142/54 143/56 151/49 138/43  Pulse: 76 66 64 68  Temp: 99.7 F (37.6 C) 98.2 F (36.8 C) 98.8 F (37.1 C) 98.8 F (37.1 C)  TempSrc: Oral Oral Oral Oral  Resp: 22 20 16 20   Height:      Weight:      SpO2: 95% 92%  91% 95%   General appearance: alert, cooperative, appears stated age and no distress Resp: clear to auscultation bilaterally Cardio: regular rate and rhythm, S1, S2 normal, no murmur, click, rub or gallop GI: soft, non-tender; bowel sounds normal; no masses,  no organomegaly  DISPOSITION: Home with home health  Discharge Instructions    Call MD for:  difficulty breathing, headache or visual disturbances    Complete by:  As  directed      Call MD for:  extreme fatigue    Complete by:  As directed      Call MD for:  hives    Complete by:  As directed      Call MD for:  persistant dizziness or light-headedness    Complete by:  As directed      Call MD for:  persistant nausea and vomiting    Complete by:  As directed      Call MD for:  severe uncontrolled pain    Complete by:  As directed      Call MD for:  temperature >100.4    Complete by:  As directed      Diet - low sodium heart healthy    Complete by:  As directed      Discharge instructions    Complete by:  As directed   Please follow up with your PCP within 1 week.  You were cared for by a hospitalist during your hospital stay. If you have any questions about your discharge medications or the care you received while you were in the hospital after you are discharged, you can call the unit and asked to speak with the hospitalist on call if the hospitalist that took care of you is not available. Once you are discharged, your primary care physician will handle any further medical issues. Please note that NO REFILLS for any discharge medications will be authorized once you are discharged, as it is imperative that you return to your primary care physician (or establish a relationship with a primary care physician if you do not have one) for your aftercare needs so that they can reassess your need for medications and monitor your lab values. If you do not have a primary care physician, you can call [phone] for a physician referral.     Increase activity slowly    Complete by:  As directed            ALLERGIES: No Known Allergies   Discharge Medication List as of 04/22/2015 12:54 PM    START taking these medications   Details  cephALEXin (KEFLEX) 500 MG capsule Take 1 capsule (500 mg total) by mouth every 8 (eight) hours. For 5 days, Starting 04/22/2015, Until Discontinued, Print      CONTINUE these medications which have NOT CHANGED   Details    alendronate (FOSAMAX) 70 MG tablet Take 70 mg by mouth once a week. On Monday or Tuesday, Starting 08/18/2013, Until Discontinued, Historical Med    amLODipine (NORVASC) 10 MG tablet Take 10 mg by mouth at bedtime. , Starting 01/07/2015, Until Discontinued, Historical Med    aspirin EC 81 MG tablet Take 81 mg by mouth daily., Until Discontinued, Historical Med    Calcium Carbonate (CALCIUM 600 PO) Take 600 mg by mouth 2 (two) times daily. , Until Discontinued, Historical Med    Cholecalciferol (VITAMIN D) 2000 UNITS tablet Take 2,000 Units by mouth daily at 12 noon., Until Discontinued, Historical Med    CRESTOR 20 MG tablet TAKE  1 TABLET (20 MG TOTAL) BY MOUTH DAILY., Normal    gabapentin (NEURONTIN) 100 MG capsule Take 100 mg by mouth 3 (three) times daily as needed (shingles pain). , Starting 03/30/2015, Until Discontinued, Historical Med    hydrALAZINE (APRESOLINE) 25 MG tablet TAKE 1 TABLET (25 MG TOTAL) BY MOUTH THREE TIMES DAILY., Normal    hydrochlorothiazide (MICROZIDE) 12.5 MG capsule TAKE 1 CAPSULE (12.5 MG TOTAL) BY MOUTH DAILY., Normal    isosorbide mononitrate (IMDUR) 30 MG 24 hr tablet TAKE 1 TABLET (30 MG TOTAL) BY MOUTH DAILY., Normal    levothyroxine (SYNTHROID, LEVOTHROID) 50 MCG tablet Take 50 mcg by mouth daily before breakfast., Until Discontinued, Historical Med    nitroGLYCERIN (NITROSTAT) 0.4 MG SL tablet Place 1 tablet (0.4 mg total) under the tongue every 5 (five) minutes as needed for chest pain., Starting 09/15/2013, Until Discontinued, Normal    omeprazole (PRILOSEC) 20 MG capsule TAKE 1 CAPSULE BY MOUTH ONCE DAILY, Normal    potassium chloride SA (K-DUR,KLOR-CON) 20 MEQ tablet Take 1 tablet (20 mEq total) by mouth 2 (two) times daily., Starting 12/11/2014, Until Discontinued, Normal    ramipril (ALTACE) 10 MG capsule TAKE 1 CAPSULE (10 MG TOTAL) BY MOUTH DAILY., Normal       Follow-up Information    Follow up with [name], SCOTT, MD. Schedule an  appointment as soon as possible for a visit in 1 week.   Specialty:  Internal Medicine   Why:  post hospitalization follow up   Contact information:   [address] [place] [address] [phone]       Follow up with [facility].   Why:  hhpt,hhot, hhrn   Contact information:   [address] [place] [address] [phone]       Follow up with [facility].   Why:  rolling walker will be brought up to room   Contact information:   [address] [place] [address] [phone]       TOTAL DISCHARGE TIME: 35 minutes  [facility] LeandroKRISHNAN,Amesha Bailey  [facility] Pager [phone]  04/22/2015, 3:40 PM

## 2015-04-22 NOTE — Progress Notes (Signed)
Physical Therapy Treatment Patient Details Name: Alyssa Graves MRN: [id number] DOB: 25-Jan-1930 Today's Date: May 20, 2015    History of Present Illness Pt adm with abd pain and diarrhea. PMH - CAD, HTN, who was treated for herpes zoster about a month ago     PT Comments    Pt doing well and plans to return home today with husband.  Follow Up Recommendations  Home health PT;Supervision for mobility/OOB     Equipment Recommendations  None recommended by PT    Recommendations for Other Services       Precautions / Restrictions Precautions Precautions: Fall Restrictions Weight Bearing Restrictions: No    Mobility  Bed Mobility Overal bed mobility: Modified Independent Bed Mobility: Supine to Sit     Supine to sit: HOB elevated;Modified independent (Device/Increase time)     General bed mobility comments: Incr time and effort  Transfers Overall transfer level: Needs assistance Equipment used: Rolling walker (2 wheeled);None Transfers: Sit to/from Stand Sit to Stand: Supervision         General transfer comment: Pt able to stand without physical assist from both bed and commode.  Ambulation/Gait Ambulation/Gait assistance: Supervision Ambulation Distance (Feet): 110 Feet Assistive device: Rolling walker (2 wheeled) Gait Pattern/deviations: Step-through pattern;Decreased step length - right;Decreased step length - left Gait velocity: decr Gait velocity interpretation: Below normal speed for age/gender General Gait Details: Verbal cues to stand more erect   Stairs            Wheelchair Mobility    Modified Rankin (Stroke Patients Only)       Balance   Sitting-balance support: No upper extremity supported;Feet supported Sitting balance-Leahy Scale: Good     Standing balance support: No upper extremity supported;During functional activity Standing balance-Leahy Scale: Poor                      Cognition Arousal/Alertness:  Awake/alert Behavior During Therapy: WFL for tasks assessed/performed Overall Cognitive Status: No family/caregiver present to determine baseline cognitive functioning Area of Impairment: Problem solving             Problem Solving: Slow processing      Exercises      General Comments        Pertinent Vitals/Pain Pain Assessment: No/denies pain    Home Living                      Prior Function            PT Goals (current goals can now be found in the care plan section) Acute Rehab PT Goals PT Goal Formulation: With patient Time For Goal Achievement: 04/28/15 Potential to Achieve Goals: Good Progress towards PT goals: Goals met and updated - see care plan    Frequency  Min 3X/week    PT Plan Current plan remains appropriate    Co-evaluation             End of Session Equipment Utilized During Treatment: Gait belt Activity Tolerance: Patient tolerated treatment well Patient left: in chair;with call bell/phone within reach (no chair alarm available)     Time: 4332-9518 PT Time Calculation (min) (ACUTE ONLY): 18 min  Charges:  $Gait Training: 8-22 mins                    G Codes:      Alyssa Graves 05/20/15, 11:12 AM [employer] PT [phone]

## 2015-04-22 NOTE — Care Management Note (Addendum)
Case Management Note  Patient Details  Name: Alyssa JensenGirtha B Liwanag MRN: [id number] Date of Birth: 03-03-1930  Subjective/Objective:     Date: 04/22/15 Spoke with patient at the bedside along with daughter. Introduced self as Sports coachcase manager and explained role in discharge planning and how to be reached. Verified patient lives in town,  with spouse  Expressed potential need for rolling walker.  [facility] will [facility] to patient's room. Verified patient anticipates to go home with family, at time of discharge and will have full-time supervision by family at this time to best of their knowledge. Patient denied needing help with their medication. Patient  is driven  to MD appointments. Verified patient has PCP Alysia PennaScott Holwerda. [facility], [facility], Soc will begin 24-48 hrs post dc.   Plan: CM will continue to follow for discharge planning and [facility] resources.              Action/Plan:   Expected Discharge Date:                  Expected Discharge Plan:  Home w Home Health Services  In-House Referral:     Discharge planning Services  CM Consult  Post Acute Care Choice:  Home Health Choice offered to:     DME Arranged:  Walker rolling DME Agency:  [facility]  HH Arranged:  [facility], OT, RN [facility]  [facility]  Status of Service:  Completed, signed off  Medicare Important Message Given:  Yes-second notification given Date Medicare IM Given:    Medicare IM give by:    Date Additional Medicare IM Given:    Additional Medicare Important Message give by:     If discussed at Long Length of Stay Meetings, dates discussed:    Additional Comments:  Leone Havenaylor, Korbyn Chopin Clinton, RN 04/22/2015, 11:43 AM

## 2015-04-22 NOTE — Progress Notes (Signed)
04/22/15 Patient being discharged home today, IV site removed and discussed with patient and family.

## 2015-04-24 LAB — STOOL CULTURE

## 2015-04-30 ENCOUNTER — Other Ambulatory Visit: Payer: Self-pay | Admitting: Cardiology

## 2015-05-11 ENCOUNTER — Encounter: Payer: Self-pay | Admitting: Cardiology

## 2015-05-11 ENCOUNTER — Ambulatory Visit (INDEPENDENT_AMBULATORY_CARE_PROVIDER_SITE_OTHER): Payer: Medicare Other | Admitting: Cardiology

## 2015-05-11 VITALS — BP 134/60 | HR 84 | Ht 63.0 in | Wt 200.0 lb

## 2015-05-11 DIAGNOSIS — R600 Localized edema: Secondary | ICD-10-CM

## 2015-05-11 DIAGNOSIS — E7800 Pure hypercholesterolemia, unspecified: Secondary | ICD-10-CM | POA: Diagnosis not present

## 2015-05-11 DIAGNOSIS — I10 Essential (primary) hypertension: Secondary | ICD-10-CM

## 2015-05-11 NOTE — Patient Instructions (Signed)
Continue your current therapy  Restrict your salt intake and wear compression hose.  We will see you in 6 months.

## 2015-05-11 NOTE — Progress Notes (Signed)
Alyssa Graves Date of Birth: 1929/11/03 Medical Record #[id number]  History of Present Illness: Alyssa Graves is seen for follow up HTN.  She has HTN, HLD and glucose intolerance. Reports past MI but past cardiac evaluation in 2010 with cardiac cath showed minor nonobstructive CAD and normal EF. Myoview from 2011 was normal. No history of stroke or TIA.   Seen back in the fall of 2014 - BP not controlled and Hydralazine was added. No means of checking her BP at home.   Seen today with her daughter. Remains very hard of hearing. She is doing well. No chest pain. Not short of breath.  Tolerating her medicines - daughter continues to manage her medicines. She was admitted in early November with abdominal pain felt to be related to gastroenteritis. Also treated for UTI. CT done showed extensive plaque in aorta and branch vessels. She does note swelling in legs R>L. She also had shingles breakout involving neck and scalp.  Current Outpatient Prescriptions  Medication Sig Dispense Refill  . alendronate (FOSAMAX) 70 MG tablet Take 70 mg by mouth once a week. On Monday or Tuesday    . amLODipine (NORVASC) 10 MG tablet Take 10 mg by mouth at bedtime.     Marland Kitchen aspirin EC 81 MG tablet Take 81 mg by mouth daily.    . Calcium Carbonate (CALCIUM 600 PO) Take 600 mg by mouth 2 (two) times daily.     . Cholecalciferol (VITAMIN D) 2000 UNITS tablet Take 2,000 Units by mouth daily at 12 noon.    . hydrALAZINE (APRESOLINE) 25 MG tablet TAKE 1 TABLET (25 MG TOTAL) BY MOUTH THREE TIMES DAILY. 270 tablet 0  . hydrochlorothiazide (MICROZIDE) 12.5 MG capsule TAKE 1 CAPSULE BY MOUTH DAILY. 30 capsule 0  . isosorbide mononitrate (IMDUR) 30 MG 24 hr tablet TAKE 1 TABLET (30 MG TOTAL) BY MOUTH DAILY. 30 tablet 6  . levothyroxine (SYNTHROID, LEVOTHROID) 50 MCG tablet Take 50 mcg by mouth daily before breakfast.    . nitroGLYCERIN (NITROSTAT) 0.4 MG SL tablet Place 1 tablet (0.4 mg total) under the tongue every 5 (five)  minutes as needed for chest pain. 25 tablet 3  . omeprazole (PRILOSEC) 20 MG capsule TAKE 1 CAPSULE BY MOUTH ONCE DAILY 30 capsule 0  . potassium chloride SA (K-DUR,KLOR-CON) 20 MEQ tablet Take 1 tablet (20 mEq total) by mouth 2 (two) times daily. (Patient taking differently: Take 20 mEq by mouth daily. ) 60 tablet 5  . ramipril (ALTACE) 10 MG capsule TAKE 1 CAPSULE (10 MG TOTAL) BY MOUTH DAILY. 30 capsule 5  . rosuvastatin (CRESTOR) 20 MG tablet TAKE 1 TABLET BY MOUTH DAILY. 30 tablet 0   No current facility-administered medications for this visit.    No Known Allergies  Past Medical History  Diagnosis Date  . HTN (hypertension)   . Hypercholesterolemia   . Glucose intolerance (impaired glucose tolerance)   . GERD (gastroesophageal reflux disease)   . Arthritis     Past Surgical History  Procedure Laterality Date  . Cholecystectomy    . Umbilical hernia repair    . Foot surgery    . Hammer toes      History  Smoking status  . Former Smoker  Smokeless tobacco  . Never Used    History  Alcohol Use No    History reviewed. No pertinent family history.  Review of Systems: The review of systems is per the HPI.  All other systems were reviewed and are negative.  Physical Exam: BP 134/60 mmHg  Pulse 84  Ht 5\' 3"  (1.6 m)  Wt 90.719 kg (200 lb)  BMI 35.44 kg/m2 Patient is very pleasant and in no acute distress. Very hard of hearing. Skin is warm and dry. Color is normal.  HEENT is unremarkable. Normocephalic/atraumatic. PERRL. Sclera are nonicteric. Neck is supple. No masses. No JVD. Lungs are clear. Cardiac exam shows a regular rate and rhythm. Abdomen is soft. Extremities show 1+ edema.  Gait and ROM are intact. No gross neurologic deficits noted.  Wt Readings from Last 3 Encounters:  05/11/15 90.719 kg (200 lb)  04/18/15 92.851 kg (204 lb 11.2 oz)  10/26/14 93.895 kg (207 lb)    LABORATORY DATA/PROCEDURES:  Lab Results  Component Value Date   WBC 5.3 04/22/2015    HGB 9.8* 04/22/2015   HCT 28.9* 04/22/2015   PLT 217 04/22/2015   GLUCOSE 97 04/22/2015   CHOL 150 08/11/2013   TRIG 150.0* 08/11/2013   HDL 54.50 08/11/2013   LDLDIRECT 172.9 04/23/2013   LDLCALC 66 08/11/2013   ALT 15 04/19/2015   AST 29 04/19/2015   NA 138 04/22/2015   K 3.2* 04/22/2015   CL 106 04/22/2015   CREATININE 1.00 04/22/2015   BUN 12 04/22/2015   CO2 25 04/22/2015   INR 1.2 02/13/2009   HGBA1C 6.6* 04/23/2013    BNP (last 3 results) No results for input(s): PROBNP in the last 8760 hours.   Assessment / Plan: 1. HTN - BP is well controlled. Continue current therapy.   2. HLD - on statin therapy.   3. Minor nonobstructive CAD per cath with negative Myoview in 2011 - now on Imdur 30 mg a day - her symptoms have resolved.   4. Edema. Mild recommend conservative measures of sodium restriction and elevation with compression hose. Diuretics were increased before but this was not tolerated due to renal dysfunction.    See back in 6 months. Labs by PCP.

## 2015-05-25 ENCOUNTER — Ambulatory Visit: Payer: Medicare Other | Admitting: Podiatry

## 2015-06-02 ENCOUNTER — Other Ambulatory Visit: Payer: Self-pay | Admitting: Cardiology

## 2015-07-01 ENCOUNTER — Other Ambulatory Visit: Payer: Self-pay | Admitting: Cardiology

## 2015-07-01 NOTE — Telephone Encounter (Signed)
Rx request sent to pharmacy.  

## 2015-08-24 ENCOUNTER — Other Ambulatory Visit: Payer: Self-pay | Admitting: Cardiology

## 2015-10-11 ENCOUNTER — Other Ambulatory Visit: Payer: Self-pay | Admitting: Cardiology

## 2015-10-11 NOTE — Telephone Encounter (Signed)
Rx refill sent to pharmacy. 

## 2015-11-05 ENCOUNTER — Other Ambulatory Visit: Payer: Self-pay | Admitting: Cardiology

## 2015-11-05 NOTE — Telephone Encounter (Signed)
Rx(s) sent to pharmacy electronically.  

## 2015-11-09 ENCOUNTER — Encounter: Payer: Self-pay | Admitting: Nurse Practitioner

## 2015-11-09 ENCOUNTER — Ambulatory Visit (INDEPENDENT_AMBULATORY_CARE_PROVIDER_SITE_OTHER): Payer: Medicare Other | Admitting: Nurse Practitioner

## 2015-11-09 VITALS — BP 170/60 | HR 52 | Ht 63.0 in | Wt 202.1 lb

## 2015-11-09 DIAGNOSIS — I2510 Atherosclerotic heart disease of native coronary artery without angina pectoris: Secondary | ICD-10-CM | POA: Diagnosis not present

## 2015-11-09 DIAGNOSIS — E785 Hyperlipidemia, unspecified: Secondary | ICD-10-CM

## 2015-11-09 DIAGNOSIS — E7800 Pure hypercholesterolemia, unspecified: Secondary | ICD-10-CM

## 2015-11-09 DIAGNOSIS — I10 Essential (primary) hypertension: Secondary | ICD-10-CM | POA: Diagnosis not present

## 2015-11-09 LAB — HEPATIC FUNCTION PANEL
ALT: 11 U/L (ref 6–29)
AST: 17 U/L (ref 10–35)
Albumin: 4.2 g/dL (ref 3.6–5.1)
Alkaline Phosphatase: 45 U/L (ref 33–130)
Bilirubin, Direct: 0.1 mg/dL (ref <=0.2)
Indirect Bilirubin: 0.3 mg/dL (ref 0.2–1.2)
Total Bilirubin: 0.4 mg/dL (ref 0.2–1.2)
Total Protein: 6.5 g/dL (ref 6.1–8.1)

## 2015-11-09 LAB — CBC
HCT: 31 % — ABNORMAL LOW (ref 35.0–45.0)
Hemoglobin: 10.3 g/dL — ABNORMAL LOW (ref 11.7–15.5)
MCH: 29.6 pg (ref 27.0–33.0)
MCHC: 33.2 g/dL (ref 32.0–36.0)
MCV: 89.1 fL (ref 80.0–100.0)
MPV: 9.5 fL (ref 7.5–12.5)
Platelets: 274 10*3/uL (ref 140–400)
RBC: 3.48 MIL/uL — ABNORMAL LOW (ref 3.80–5.10)
RDW: 14.5 % (ref 11.0–15.0)
WBC: 6.3 10*3/uL (ref 3.8–10.8)

## 2015-11-09 LAB — LIPID PANEL
Cholesterol: 136 mg/dL (ref 125–200)
HDL: 53 mg/dL (ref >=46)
LDL Cholesterol: 44 mg/dL (ref <130)
Total CHOL/HDL Ratio: 2.6 Ratio (ref <=5.0)
Triglycerides: 194 mg/dL — ABNORMAL HIGH (ref <150)
VLDL: 39 mg/dL — ABNORMAL HIGH (ref <30)

## 2015-11-09 LAB — BASIC METABOLIC PANEL
BUN: 22 mg/dL (ref 7–25)
CO2: 25 mmol/L (ref 20–31)
Calcium: 8.8 mg/dL (ref 8.6–10.4)
Chloride: 105 mmol/L (ref 98–110)
Creat: 1.13 mg/dL — ABNORMAL HIGH (ref 0.60–0.88)
Glucose, Bld: 108 mg/dL — ABNORMAL HIGH (ref 65–99)
Potassium: 4.3 mmol/L (ref 3.5–5.3)
Sodium: 139 mmol/L (ref 135–146)

## 2015-11-09 NOTE — Progress Notes (Signed)
CARDIOLOGY OFFICE NOTE  Date:  11/09/2015    Alyssa Graves Date of Birth: April 13, 1930 Medical Record #[id number]  PCP:  [name], SCOTT, MD  Cardiologist:  SwazilandJordan    Chief Complaint  Patient presents with  . Hypertension  . Hyperlipidemia  . Coronary Artery Disease    6 month check - seen for Dr. SwazilandJordan    History of Present Illness: Alyssa Graves is a 82 y.o. female who presents today for a follow up/6 month check. Seen for Dr. SwazilandJordan.  She has a history of HTN, HLD and glucose intolerance. Reports past MI but past cardiac evaluation in 2010 with cardiac cath showed minor nonobstructive CAD and normal EF. Myoview from 2011 was normal. No history of stroke or TIA.   Seen back in the fall of 2014 - BP not controlled and Hydralazine was added. No means of checking her BP at home.   Last seen back in November by Dr. SwazilandJordan. Felt to be doing ok. Had been admitted earlier that month with abdominal pain felt to be related to gastroenteritis. Also treated for UTI. CT done showed extensive plaque in aorta and branch vessels. She does note swelling in legs R>L. She also had shingles breakout involving neck and scalp.  Comes back today. Here with her daughter. Doing ok. Hard of hearing. Seems to be more limited by generalized arthritis. Has not been getting her Imdur - says it has continued to give her headaches - she has refused to take it so the daughter just stopped trying to give. Hard to say if she is having chest pain. Husband is sick - this seems to be causing some stress. No sl NTG use.   Past Medical History  Diagnosis Date  . HTN (hypertension)   . Hypercholesterolemia   . Glucose intolerance (impaired glucose tolerance)   . GERD (gastroesophageal reflux disease)   . Arthritis     Past Surgical History  Procedure Laterality Date  . Cholecystectomy    . Umbilical hernia repair    . Foot surgery    . Hammer toes       Medications: Current Outpatient  Prescriptions  Medication Sig Dispense Refill  . alendronate (FOSAMAX) 70 MG tablet Take 70 mg by mouth once a week. On Monday or Tuesday    . amLODipine (NORVASC) 10 MG tablet Take 10 mg by mouth at bedtime.     Marland Kitchen. aspirin EC 81 MG tablet Take 81 mg by mouth daily.    . Calcium Carbonate (CALCIUM 600 PO) Take 600 mg by mouth 2 (two) times daily.     . Cholecalciferol (VITAMIN D) 2000 UNITS tablet Take 2,000 Units by mouth daily at 12 noon.    . hydrALAZINE (APRESOLINE) 25 MG tablet TAKE 1 TABLET (25 MG TOTAL) BY MOUTH THREE TIMES DAILY. 270 tablet 0  . hydrochlorothiazide (MICROZIDE) 12.5 MG capsule TAKE 1 CAPSULE BY MOUTH DAILY. 30 capsule 4  . levothyroxine (SYNTHROID, LEVOTHROID) 50 MCG tablet Take 50 mcg by mouth daily before breakfast.    . nitroGLYCERIN (NITROSTAT) 0.4 MG SL tablet Place 1 tablet (0.4 mg total) under the tongue every 5 (five) minutes as needed for chest pain. 25 tablet 3  . omeprazole (PRILOSEC) 20 MG capsule TAKE 1 CAPSULE BY MOUTH ONCE DAILY 90 capsule 3  . potassium chloride SA (K-DUR,KLOR-CON) 20 MEQ tablet TAKE 1 TABLET (20 MEQ TOTAL) BY MOUTH TWO   (TWO) TIMES DAILY. 180 tablet 3  . ramipril (ALTACE) 10  MG capsule TAKE 1 CAPSULE (10 MG TOTAL) BY MOUTH DAILY. 90 capsule 3  . rosuvastatin (CRESTOR) 20 MG tablet TAKE 1 TABLET BY MOUTH DAILY. 30 tablet 4   No current facility-administered medications for this visit.    Allergies: No Known Allergies  Social History: The patient  reports that she has quit smoking. She has never used smokeless tobacco. She reports that she does not drink alcohol or use illicit drugs.   Family History: The patient's family history is not on file.   Review of Systems: Please see the history of present illness.   Otherwise, the review of systems is positive for none.   All other systems are reviewed and negative.   Physical Exam: VS:  BP 170/60 mmHg  Pulse 52  Ht 5\' 3"  (1.6 m)  Wt 202 lb 1.9 oz (91.681 kg)  BMI 35.81 kg/m2 .   BMI Body mass index is 35.81 kg/(m^2).  Wt Readings from Last 3 Encounters:  11/09/15 202 lb 1.9 oz (91.681 kg)  05/11/15 200 lb (90.719 kg)  04/18/15 204 lb 11.2 oz (92.851 kg)   Repeat BP by me is down to 124/60.  General: Pleasant. Elderly black female who is alert and in no acute distress.  HEENT: Normal. She is very hard of hearing.  Neck: Supple, no JVD, carotid bruits, or masses noted.  Cardiac: Regular rate and rhythm. No murmurs, rubs, or gallops. No edema.  Respiratory:  Lungs are clear to auscultation bilaterally with normal work of breathing.  GI: Soft and nontender.  MS: No deformity or atrophy. Gait limited.  Skin: Warm and dry. Color is normal.  Neuro:  Strength and sensation are intact and no gross focal deficits noted.  Psych: Alert, appropriate and with normal affect.   LABORATORY DATA:  EKG:  EKG is not ordered today.  Lab Results  Component Value Date   WBC 5.3 04/22/2015   HGB 9.8* 04/22/2015   HCT 28.9* 04/22/2015   PLT 217 04/22/2015   GLUCOSE 97 04/22/2015   CHOL 150 08/11/2013   TRIG 150.0* 08/11/2013   HDL 54.50 08/11/2013   LDLDIRECT 172.9 04/23/2013   LDLCALC 66 08/11/2013   ALT 15 04/19/2015   AST 29 04/19/2015   NA 138 04/22/2015   K 3.2* 04/22/2015   CL 106 04/22/2015   CREATININE 1.00 04/22/2015   BUN 12 04/22/2015   CO2 25 04/22/2015   INR 1.2 02/13/2009   HGBA1C 6.6* 04/23/2013    BNP (last 3 results) No results for input(s): BNP in the last 8760 hours.  ProBNP (last 3 results) No results for input(s): PROBNP in the last 8760 hours.   Other Studies Reviewed Today:   Assessment/Plan: 1. HTN - BP recheck by me is much better. No change with current regimen.   2. HLD - on statin therapy. Lab today.   3. Minor nonobstructive CAD per cath with negative Myoview in 2011 - not able to continue Imdur - hard to say if her chest pain is any worse or not. Family feels this is more related to stress and her complaints seem more  focused on arthritis and not chest pain.   4. Edema. Mild recommend conservative measures of sodium restriction and elevation with compression hose. Diuretics were increased before but this was not tolerated due to renal dysfunction.   Current medicines are reviewed with the patient today.  The patient does not have concerns regarding medicines other than what has been noted above.  The following changes have  been made:  See above.  Labs/ tests ordered today include:    Orders Placed This Encounter  Procedures  . Basic metabolic panel  . CBC  . Hepatic function panel  . Lipid panel     Disposition:   FU with Dr. Swaziland in 6 months.   Patient is agreeable to this plan and will call if any problems develop in the interim.   Signed: Rosalio Macadamia, RN, ANP-C 11/09/2015 9:55 AM  [facility] [address] [place], [place]  [address] Phone: [phone] Fax: [phone]

## 2015-11-09 NOTE — Patient Instructions (Addendum)
We will be checking the following labs today - BMET, CBC, HPF and lipids   Medication Instructions:    Continue with your current medicines.   I did take the Imdur off the medicine list today    Testing/Procedures To Be Arranged:  N/A  Follow-Up:   See Dr. SwazilandJordan in 6 months.    Other Special Instructions:   N/A    If you need a refill on your cardiac medications before your next appointment, please call your pharmacy.   Call the [facility] office at [phone] if you have any questions, problems or concerns.

## 2015-11-29 ENCOUNTER — Other Ambulatory Visit: Payer: Self-pay | Admitting: Cardiology

## 2015-11-29 NOTE — Telephone Encounter (Signed)
Rx request sent to pharmacy.  

## 2016-03-28 ENCOUNTER — Other Ambulatory Visit: Payer: Self-pay | Admitting: Cardiology

## 2016-05-25 ENCOUNTER — Encounter: Payer: Self-pay | Admitting: Cardiology

## 2016-05-27 NOTE — Progress Notes (Deleted)
CARDIOLOGY OFFICE NOTE  Date:  05/27/2016    Alyssa Graves Date of Birth: 1929-09-17 Medical Record #[id number]  PCP:  [name], MD  Cardiologist:  [place]    No chief complaint on file.   History of Present Illness: Alyssa Graves is a 82 y.o. female who presents today for a follow up/6 month check. Seen for Dr. [place].  She has a history of HTN, HLD and glucose intolerance. Reports past MI but past cardiac evaluation in 2010 with cardiac cath showed minor nonobstructive CAD and normal EF. Myoview from 2011 was normal. No history of stroke or TIA.   Seen back in the fall of 2014 - BP not controlled and Hydralazine was added. No means of checking her BP at home.   Admitted November 2016 with abdominal pain felt to be related to gastroenteritis. Also treated for UTI. CT done showed extensive plaque in aorta and branch vessels.S She does note swelling in legs R>L. She also had shingles breakout involving neck and scalp.  Comes back today. Here with her daughter. Doing ok. Hard of hearing. Seems to be more limited by generalized arthritis. Has not been getting her Imdur - says it has continued to give her headaches - she has refused to take it so the daughter just stopped trying to give. Hard to say if she is having chest pain. Husband is sick - this seems to be causing some stress. No sl NTG use.   Past Medical History:  Diagnosis Date  . Arthritis   . GERD (gastroesophageal reflux disease)   . Glucose intolerance (impaired glucose tolerance)   . HTN (hypertension)   . Hypercholesterolemia     Past Surgical History:  Procedure Laterality Date  . CHOLECYSTECTOMY    . FOOT SURGERY    . hammer toes    . UMBILICAL HERNIA REPAIR       Medications: Current Outpatient Prescriptions  Medication Sig Dispense Refill  . alendronate (FOSAMAX) 70 MG tablet Take 70 mg by mouth once a week. On Monday or Tuesday    . amLODipine (NORVASC) 10 MG tablet Take 10 mg by  mouth at bedtime.     Marland Kitchen aspirin EC 81 MG tablet Take 81 mg by mouth daily.    . Calcium Carbonate (CALCIUM 600 PO) Take 600 mg by mouth 2 (two) times daily.     . Cholecalciferol (VITAMIN D) 2000 UNITS tablet Take 2,000 Units by mouth daily at 12 noon.    . hydrALAZINE (APRESOLINE) 25 MG tablet TAKE 1 TABLET (25 MG TOTAL) BY MOUTH THREE TIMES DAILY. 270 tablet 0  . hydrochlorothiazide (MICROZIDE) 12.5 MG capsule TAKE 1 CAPSULE BY MOUTH DAILY. 30 capsule 5  . levothyroxine (SYNTHROID, LEVOTHROID) 50 MCG tablet Take 50 mcg by mouth daily before breakfast.    . nitroGLYCERIN (NITROSTAT) 0.4 MG SL tablet Place 1 tablet (0.4 mg total) under the tongue every 5 (five) minutes as needed for chest pain. 25 tablet 3  . omeprazole (PRILOSEC) 20 MG capsule TAKE 1 CAPSULE BY MOUTH ONCE DAILY 90 capsule 0  . potassium chloride SA (K-DUR,KLOR-CON) 20 MEQ tablet TAKE 1 TABLET (20 MEQ TOTAL) BY MOUTH TWO TIMES DAILY. 180 tablet 0  . ramipril (ALTACE) 10 MG capsule TAKE 1 CAPSULE (10 MG TOTAL) BY MOUTH DAILY. 90 capsule 0  . rosuvastatin (CRESTOR) 20 MG tablet TAKE 1 TABLET BY MOUTH DAILY. 30 tablet 5   No current facility-administered medications for this visit.  Allergies: No Known Allergies  Social History: The patient  reports that she has quit smoking. She has never used smokeless tobacco. She reports that she does not drink alcohol or use drugs.   Family History: The patient's family history is not on file.   Review of Systems: Please see the history of present illness.   Otherwise, the review of systems is positive for none.   All other systems are reviewed and negative.   Physical Exam: VS:  There were no vitals taken for this visit. Marland Kitchen.  BMI There is no height or weight on file to calculate BMI.  Wt Readings from Last 3 Encounters:  11/09/15 202 lb 1.9 oz (91.7 kg)  05/11/15 200 lb (90.7 kg)  04/18/15 204 lb 11.2 oz (92.9 kg)   Repeat BP by me is down to 124/60.  General: Pleasant.  Elderly black female who is alert and in no acute distress.   HEENT: Normal. She is very hard of hearing.   Neck: Supple, no JVD, carotid bruits, or masses noted.  Cardiac: Regular rate and rhythm. No murmurs, rubs, or gallops. No edema.  Respiratory:  Lungs are clear to auscultation bilaterally with normal work of breathing.  GI: Soft and nontender.  MS: No deformity or atrophy. Gait limited.  Skin: Warm and dry. Color is normal.  Neuro:  Strength and sensation are intact and no gross focal deficits noted.  Psych: Alert, appropriate and with normal affect.   LABORATORY DATA:  EKG:  EKG is not ordered today.  Lab Results  Component Value Date   WBC 6.3 11/09/2015   HGB 10.3 (L) 11/09/2015   HCT 31.0 (L) 11/09/2015   PLT 274 11/09/2015   GLUCOSE 108 (H) 11/09/2015   CHOL 136 11/09/2015   TRIG 194 (H) 11/09/2015   HDL 53 11/09/2015   LDLDIRECT 172.9 04/23/2013   LDLCALC 44 11/09/2015   ALT 11 11/09/2015   AST 17 11/09/2015   NA 139 11/09/2015   K 4.3 11/09/2015   CL 105 11/09/2015   CREATININE 1.13 (H) 11/09/2015   BUN 22 11/09/2015   CO2 25 11/09/2015   INR 1.2 02/13/2009   HGBA1C 6.6 (H) 04/23/2013    BNP (last 3 results) No results for input(s): BNP in the last 8760 hours.  ProBNP (last 3 results) No results for input(s): PROBNP in the last 8760 hours.   Other Studies Reviewed Today: Labs from 11/18/15: cholesterol 139, triglycerides 151, LDL 64, HDL 45. A1c5.6%, Bun 18, Creatinine 1.2. Other CMET normal.   Assessment/Plan: 1. HTN - BP recheck by me is much better. No change with current regimen.   2. HLD - on statin therapy. Lab today.   3. Minor nonobstructive CAD per cath with negative Myoview in 2011 - not able to continue Imdur - hard to say if her chest pain is any worse or not. Family feels this is more related to stress and her complaints seem more focused on arthritis and not chest pain.   4. Edema. Mild recommend conservative measures of sodium  restriction and elevation with compression hose. Diuretics were increased before but this was not tolerated due to renal dysfunction.   Current medicines are reviewed with the patient today.  The patient does not have concerns regarding medicines other than what has been noted above.  The following changes have been made:  See above.  Labs/ tests ordered today include:    No orders of the defined types were placed in this encounter.  Disposition:   FU with Dr. [place]Jordan in 6 months.   Patient is agreeable to this plan and will call if any problems develop in the interim.   Signed: Rosalio MacadamiaLori C. Gerhardt, RN, ANP-C 05/27/2016 11:17 AM  [facility] [address] [place], [place]  [address] Phone: [phone] Fax: [phone]

## 2016-05-30 ENCOUNTER — Ambulatory Visit: Payer: Medicare Other | Admitting: Cardiology

## 2016-06-22 NOTE — Progress Notes (Deleted)
CARDIOLOGY OFFICE NOTE  Date:  06/22/2016    Alyssa Graves Date of Birth: 1929/10/28 Medical Record #[id number]  PCP:  [name], SCOTT, MD  Cardiologist:  [place]    No chief complaint on file.   History of Present Illness: Alyssa JensenGirtha B Graves is a 83 y.o. female who presents today for a follow up/6 month check. Seen for Dr. [place].  She has a history of HTN, HLD and glucose intolerance. Reports past MI but past cardiac evaluation in 2010 with cardiac cath showed minor nonobstructive CAD and normal EF. Myoview from 2011 was normal. No history of stroke or TIA.   Seen back in the fall of 2014 - BP not controlled and Hydralazine was added. No means of checking her BP at home.   Last seen back in November by Dr. [place]. Felt to be doing ok. Had been admitted earlier that month with abdominal pain felt to be related to gastroenteritis. Also treated for UTI. CT done showed extensive plaque in aorta and branch vessels. She does note swelling in legs R>L. She also had shingles breakout involving neck and scalp.  Comes back today. Here with her daughter. Doing ok. Hard of hearing. Seems to be more limited by generalized arthritis. Has not been getting her Imdur - says it has continued to give her headaches - she has refused to take it so the daughter just stopped trying to give. Hard to say if she is having chest pain. Husband is sick - this seems to be causing some stress. No sl NTG use.   Past Medical History:  Diagnosis Date  . Arthritis   . GERD (gastroesophageal reflux disease)   . Glucose intolerance (impaired glucose tolerance)   . HTN (hypertension)   . Hypercholesterolemia     Past Surgical History:  Procedure Laterality Date  . CHOLECYSTECTOMY    . FOOT SURGERY    . hammer toes    . UMBILICAL HERNIA REPAIR       Medications: Current Outpatient Prescriptions  Medication Sig Dispense Refill  . alendronate (FOSAMAX) 70 MG tablet Take 70 mg by mouth once a week. On  Monday or Tuesday    . amLODipine (NORVASC) 10 MG tablet Take 10 mg by mouth at bedtime.     Marland Kitchen. aspirin EC 81 MG tablet Take 81 mg by mouth daily.    . Calcium Carbonate (CALCIUM 600 PO) Take 600 mg by mouth 2 (two) times daily.     . Cholecalciferol (VITAMIN D) 2000 UNITS tablet Take 2,000 Units by mouth daily at 12 noon.    . hydrALAZINE (APRESOLINE) 25 MG tablet TAKE 1 TABLET (25 MG TOTAL) BY MOUTH THREE TIMES DAILY. 270 tablet 0  . hydrochlorothiazide (MICROZIDE) 12.5 MG capsule TAKE 1 CAPSULE BY MOUTH DAILY. 30 capsule 5  . levothyroxine (SYNTHROID, LEVOTHROID) 50 MCG tablet Take 50 mcg by mouth daily before breakfast.    . nitroGLYCERIN (NITROSTAT) 0.4 MG SL tablet Place 1 tablet (0.4 mg total) under the tongue every 5 (five) minutes as needed for chest pain. 25 tablet 3  . omeprazole (PRILOSEC) 20 MG capsule TAKE 1 CAPSULE BY MOUTH ONCE DAILY 90 capsule 0  . potassium chloride SA (K-DUR,KLOR-CON) 20 MEQ tablet TAKE 1 TABLET (20 MEQ TOTAL) BY MOUTH TWO TIMES DAILY. 180 tablet 0  . ramipril (ALTACE) 10 MG capsule TAKE 1 CAPSULE (10 MG TOTAL) BY MOUTH DAILY. 90 capsule 0  . rosuvastatin (CRESTOR) 20 MG tablet TAKE 1 TABLET BY MOUTH DAILY.  30 tablet 5   No current facility-administered medications for this visit.     Allergies: No Known Allergies  Social History: The patient  reports that she has quit smoking. She has never used smokeless tobacco. She reports that she does not drink alcohol or use drugs.   Family History: The patient's family history is not on file.   Review of Systems: Please see the history of present illness.   Otherwise, the review of systems is positive for none.   All other systems are reviewed and negative.   Physical Exam: VS:  There were no vitals taken for this visit. Marland Kitchen  BMI There is no height or weight on file to calculate BMI.  Wt Readings from Last 3 Encounters:  11/09/15 202 lb 1.9 oz (91.7 kg)  05/11/15 200 lb (90.7 kg)  04/18/15 204 lb 11.2 oz  (92.9 kg)   Repeat BP by me is down to 124/60.  General: Pleasant. Elderly black female who is alert and in no acute distress.   HEENT: Normal. She is very hard of hearing.   Neck: Supple, no JVD, carotid bruits, or masses noted.  Cardiac: Regular rate and rhythm. No murmurs, rubs, or gallops. No edema.  Respiratory:  Lungs are clear to auscultation bilaterally with normal work of breathing.  GI: Soft and nontender.  MS: No deformity or atrophy. Gait limited.  Skin: Warm and dry. Color is normal.  Neuro:  Strength and sensation are intact and no gross focal deficits noted.  Psych: Alert, appropriate and with normal affect.   LABORATORY DATA:  EKG:  EKG is not ordered today.  Lab Results  Component Value Date   WBC 6.3 11/09/2015   HGB 10.3 (L) 11/09/2015   HCT 31.0 (L) 11/09/2015   PLT 274 11/09/2015   GLUCOSE 108 (H) 11/09/2015   CHOL 136 11/09/2015   TRIG 194 (H) 11/09/2015   HDL 53 11/09/2015   LDLDIRECT 172.9 04/23/2013   LDLCALC 44 11/09/2015   ALT 11 11/09/2015   AST 17 11/09/2015   NA 139 11/09/2015   K 4.3 11/09/2015   CL 105 11/09/2015   CREATININE 1.13 (H) 11/09/2015   BUN 22 11/09/2015   CO2 25 11/09/2015   INR 1.2 02/13/2009   HGBA1C 6.6 (H) 04/23/2013    Labs dated 11/18/15: cholesterol 139, triglycerides 151, LDL 64, HDL 45. BUN 18, creatinine 1.2. Other CMET and TSH normal Dated 06/02/16: A1c 5.7%  Other Studies Reviewed Today:   Assessment/Plan: 1. HTN - BP recheck by me is much better. No change with current regimen.   2. HLD - on statin therapy. Lab today.   3. Minor nonobstructive CAD per cath with negative Myoview in 2011 - not able to continue Imdur - hard to say if her chest pain is any worse or not. Family feels this is more related to stress and her complaints seem more focused on arthritis and not chest pain.   4. Edema. Mild recommend conservative measures of sodium restriction and elevation with compression hose. Diuretics were  increased before but this was not tolerated due to renal dysfunction.   Current medicines are reviewed with the patient today.  The patient does not have concerns regarding medicines other than what has been noted above.  The following changes have been made:  See above.  Labs/ tests ordered today include:    No orders of the defined types were placed in this encounter.    Disposition:   FU with Dr. [place] in  6 months.   Patient is agreeable to this plan and will call if any problems develop in the interim.   Signed: Rosalio Macadamia, RN, ANP-C 06/22/2016 4:54 PM  [facility] [address] [place], [place]  [address] Phone: [phone] Fax: [phone]

## 2016-06-27 ENCOUNTER — Ambulatory Visit: Payer: Medicare Other | Admitting: Cardiology

## 2016-06-29 ENCOUNTER — Encounter: Payer: Self-pay | Admitting: Cardiology

## 2016-06-29 ENCOUNTER — Ambulatory Visit (INDEPENDENT_AMBULATORY_CARE_PROVIDER_SITE_OTHER): Payer: Medicare Other | Admitting: Cardiology

## 2016-06-29 DIAGNOSIS — E785 Hyperlipidemia, unspecified: Secondary | ICD-10-CM | POA: Diagnosis not present

## 2016-06-29 DIAGNOSIS — I2510 Atherosclerotic heart disease of native coronary artery without angina pectoris: Secondary | ICD-10-CM | POA: Diagnosis not present

## 2016-06-29 DIAGNOSIS — I10 Essential (primary) hypertension: Secondary | ICD-10-CM | POA: Diagnosis not present

## 2016-06-29 DIAGNOSIS — H903 Sensorineural hearing loss, bilateral: Secondary | ICD-10-CM

## 2016-06-29 NOTE — Assessment & Plan Note (Signed)
Non obstructive CAD in 2010, low risk Myoview in 2011

## 2016-06-29 NOTE — Progress Notes (Signed)
06/29/2016 Alyssa Graves   11/03/1929  [id number]  Primary Physician Alyssa Penna, MD Primary Cardiologist: Dr Alyssa Graves   HPI:  83 y.o. female, HOH and declines to use hearing aid, who presents today for a follow up/6 month check. Seen for Dr. [place]. She has a history of HTN, HLD and glucose intolerance. Reports past MI but past cardiac evaluation in 2010 with cardiac cath showed minor nonobstructive CAD and normal EF. Myoview from 2011 was normal. No history of stroke or TIA. She has occasional chest discomfort but the daughter admits the pt doesn't actually complain of chest pain, the daughter thinks she has by observation. The ambulates around her home and occasionally goes shopping.    Current Outpatient Prescriptions  Medication Sig Dispense Refill  . alendronate (FOSAMAX) 70 MG tablet Take 70 mg by mouth once a week. On Monday or Tuesday    . amLODipine (NORVASC) 10 MG tablet Take 10 mg by mouth at bedtime.     Marland Kitchen aspirin EC 81 MG tablet Take 81 mg by mouth daily.    . Calcium Carbonate (CALCIUM 600 PO) Take 600 mg by mouth 2 (two) times daily.     . Cholecalciferol (VITAMIN D) 2000 UNITS tablet Take 2,000 Units by mouth daily at 12 noon.    . hydrALAZINE (APRESOLINE) 25 MG tablet TAKE 1 TABLET (25 MG TOTAL) BY MOUTH THREE TIMES DAILY. 270 tablet 0  . hydrochlorothiazide (MICROZIDE) 12.5 MG capsule TAKE 1 CAPSULE BY MOUTH DAILY. 30 capsule 5  . levothyroxine (SYNTHROID, LEVOTHROID) 50 MCG tablet Take 50 mcg by mouth daily before breakfast.    . nitroGLYCERIN (NITROSTAT) 0.4 MG SL tablet Place 1 tablet (0.4 mg total) under the tongue every 5 (five) minutes as needed for chest pain. 25 tablet 3  . omeprazole (PRILOSEC) 20 MG capsule TAKE 1 CAPSULE BY MOUTH ONCE DAILY 90 capsule 0  . potassium chloride SA (K-DUR,KLOR-CON) 20 MEQ tablet TAKE 1 TABLET (20 MEQ TOTAL) BY MOUTH TWO TIMES DAILY. 180 tablet 0  . ramipril (ALTACE) 10 MG capsule TAKE 1 CAPSULE (10 MG TOTAL) BY MOUTH DAILY.  90 capsule 0  . rosuvastatin (CRESTOR) 20 MG tablet TAKE 1 TABLET BY MOUTH DAILY. 30 tablet 5   No current facility-administered medications for this visit.     No Known Allergies  Social History   Social History  . Marital status: Married    Spouse name: N/A  . Number of children: N/A  . Years of education: N/A   Occupational History  . Not on file.   Social History Main Topics  . Smoking status: Former Games developer  . Smokeless tobacco: Never Used  . Alcohol use No  . Drug use: No  . Sexual activity: Not Currently   Other Topics Concern  . Not on file   Social History Narrative  . No narrative on file     Review of Systems: General: negative for chills, fever, night sweats or weight changes.  Cardiovascular: negative for chest pain, dyspnea on exertion, edema, orthopnea, palpitations, paroxysmal nocturnal dyspnea or shortness of breath Dermatological: negative for rash Respiratory: negative for cough or wheezing Urologic: negative for hematuria Abdominal: negative for nausea, vomiting, diarrhea, bright red blood per rectum, melena, or hematemesis Neurologic: negative for visual changes, syncope, or dizziness All other systems reviewed and are otherwise negative except as noted above.    Blood pressure 138/60, pulse 60, height 5\' 3"  (1.6 m), weight 207 lb 3.2 oz (94 kg).  General appearance:  alert, cooperative, no distress, moderately obese and HOH Neck: no carotid bruit and no JVD Lungs: clear to auscultation bilaterally Heart: regular rate and rhythm Extremities: trace edema Neurologic: Grossly normal  EKG NSR, TWI 1, and AVL- old findings  ASSESSMENT AND PLAN:   HTN (hypertension) B/P acceptable on current medications. I encouraged the family to watch her sodium intake  Dyslipidemia Her LDL was 44 in May 2017-on Crestor 20 mg  Coronary atherosclerosis Non obstructive CAD in 2010, low risk Myoview in 2011  LOSS, SENSORINEURAL HEARING, BILAT Pt declines  to use hearing aid   PLAN  I encouraged the daughter to limit the pt's salt intake. We discussed typical angina signs and symptoms and she admits she has not seen these but will be alert for them and contact us if needed. Follow up with Dr [place] in 6 months. Her PCP follows her lab.  Alyssa ShelterLuke Nance Mccombs PA-C 06/29/2016 8:43 AM

## 2016-06-29 NOTE — Assessment & Plan Note (Addendum)
B/P acceptable on current medications. I encouraged the family to watch her sodium intake

## 2016-06-29 NOTE — Assessment & Plan Note (Signed)
Pt declines to use hearing aid

## 2016-06-29 NOTE — Assessment & Plan Note (Signed)
Her LDL was 44 in May 2017-on Crestor 20 mg

## 2016-06-29 NOTE — Patient Instructions (Signed)
Medication Instructions:  Your physician recommends that you continue on your current medications as directed. Please refer to the Current Medication list given to you today.  Labwork: none  Testing/Procedures: none  Follow-Up: Your physician wants you to follow-up in: 6 month ov with Dr Jordan  You will receive a reminder letter in the mail two months in advance. If you don't receive a letter, please call our office to schedule the follow-up appointment.  If you need a refill on your cardiac medications before your next appointment, please call your pharmacy.   

## 2016-07-17 ENCOUNTER — Other Ambulatory Visit: Payer: Self-pay | Admitting: Cardiology

## 2016-07-18 NOTE — Telephone Encounter (Signed)
Rx(s) sent to pharmacy electronically.  

## 2016-09-29 ENCOUNTER — Ambulatory Visit: Payer: Medicare Other | Admitting: Podiatry

## 2016-10-18 ENCOUNTER — Ambulatory Visit: Payer: Medicare Other | Admitting: Podiatry

## 2016-11-17 ENCOUNTER — Other Ambulatory Visit: Payer: Self-pay | Admitting: Cardiology

## 2016-11-20 NOTE — Telephone Encounter (Signed)
REFILL 

## 2016-11-24 ENCOUNTER — Ambulatory Visit (INDEPENDENT_AMBULATORY_CARE_PROVIDER_SITE_OTHER): Payer: Medicare Other | Admitting: Podiatry

## 2016-11-24 DIAGNOSIS — M79676 Pain in unspecified toe(s): Secondary | ICD-10-CM | POA: Diagnosis not present

## 2016-11-24 DIAGNOSIS — B351 Tinea unguium: Secondary | ICD-10-CM

## 2016-11-24 NOTE — Progress Notes (Signed)
Complaint:  Visit Type: Patient returns to my office for continued preventative foot care services. Complaint: Patient states" my nails have grown long and thick and become painful to walk and wear shoes"  The patient presents for preventative foot care services. No changes to ROS.  Patient has not been seen for over a year.  Podiatric Exam: Vascular: dorsalis pedis and posterior tibial pulses are palpable bilateral. Capillary return is immediate. Temperature gradient is WNL. Skin turgor WNL  Sensorium: Normal Semmes Weinstein monofilament test. Normal tactile sensation bilaterally. Nail Exam: Pt has thick disfigured discolored nails with subungual debris noted bilateral entire nail hallux through fifth toenails Ulcer Exam: There is no evidence of ulcer or pre-ulcerative changes or infection. Orthopedic Exam: Muscle tone and strength are WNL. No limitations in general ROM. No crepitus or effusions noted. Foot type and digits show no abnormalities. Bony prominences are unremarkable. Skin: No Porokeratosis. No infection or ulcers  Diagnosis:  Onychomycosis, , Pain in right toe, pain in left toes  Treatment & Plan Procedures and Treatment: Consent by patient was obtained for treatment procedures. The patient understood the discussion of treatment and procedures well. All questions were answered thoroughly reviewed. Debridement of mycotic and hypertrophic toenails, 1 through 5 bilateral and clearing of subungual debris. No ulceration, no infection noted.  Return Visit-Office Procedure: Patient instructed to return to the office for a follow up visit 3 months for continued evaluation and treatment.    Helane GuntherGregory Neng Albee DPM

## 2016-12-08 ENCOUNTER — Encounter: Payer: Self-pay | Admitting: *Deleted

## 2016-12-26 NOTE — Progress Notes (Signed)
Alyssa Graves Date of Birth: [birth year] Medical Record #[id number]  History of Present Illness: Alyssa Graves is seen for follow up HTN.  She has HTN, HLD and glucose intolerance. Reports past MI but past cardiac evaluation in 2010 with cardiac cath showed minor nonobstructive CAD and normal EF. Myoview from 2011 was normal. No history of stroke or TIA. Intolerant of nitrates in the past due to HA.   Seen today with her daughter. Remains very hard of hearing. She is doing well. No chest pain. Not short of breath.  Tolerating her medicines - daughter continues to manage her medicines. She complains today of pain in her right shoulder radiating down her arm. Hurts to move it.   Current Outpatient Prescriptions  Medication Sig Dispense Refill  . alendronate (FOSAMAX) 70 MG tablet Take 70 mg by mouth once a week. On Monday or Tuesday    . amLODipine (NORVASC) 10 MG tablet Take 10 mg by mouth at bedtime.     Marland Kitchen aspirin EC 81 MG tablet Take 81 mg by mouth daily.    . Calcium Carbonate (CALCIUM 600 PO) Take 600 mg by mouth 2 (two) times daily.     . Cholecalciferol (VITAMIN D) 2000 UNITS tablet Take 2,000 Units by mouth daily at 12 noon.    . hydrALAZINE (APRESOLINE) 25 MG tablet TAKE 1 TABLET (25 MG TOTAL) BY MOUTH THREE TIMES DAILY. 270 tablet 3  . hydrochlorothiazide (MICROZIDE) 12.5 MG capsule TAKE 1 CAPSULE BY MOUTH DAILY. 30 capsule 6  . levothyroxine (SYNTHROID, LEVOTHROID) 50 MCG tablet Take 50 mcg by mouth daily before breakfast.    . nitroGLYCERIN (NITROSTAT) 0.4 MG SL tablet Place 1 tablet (0.4 mg total) under the tongue every 5 (five) minutes as needed for chest pain. 25 tablet 3  . omeprazole (PRILOSEC) 20 MG capsule TAKE 1 CAPSULE BY MOUTH ONCE DAILY 90 capsule 1  . potassium chloride SA (K-DUR,KLOR-CON) 20 MEQ tablet TAKE 1 TABLET (20 MEQ TOTAL) BY MOUTH TWO TIMES DAILY. 180 tablet 1  . ramipril (ALTACE) 10 MG capsule TAKE 1 CAPSULE (10 MG TOTAL) BY MOUTH DAILY. 90 capsule 1  .  rosuvastatin (CRESTOR) 20 MG tablet TAKE 1 TABLET BY MOUTH DAILY. 30 tablet 6   No current facility-administered medications for this visit.     No Known Allergies  Past Medical History:  Diagnosis Date  . Arthritis   . GERD (gastroesophageal reflux disease)   . Glucose intolerance (impaired glucose tolerance)   . HTN (hypertension)   . Hypercholesterolemia     Past Surgical History:  Procedure Laterality Date  . CHOLECYSTECTOMY    . FOOT SURGERY    . hammer toes    . UMBILICAL HERNIA REPAIR      History  Smoking Status  . Former Smoker  Smokeless Tobacco  . Never Used    History  Alcohol Use No    No family history on file.  Review of Systems: The review of systems is per the HPI.  All other systems were reviewed and are negative.  Physical Exam: BP (!) 134/54 (BP Location: Left Arm, Cuff Size: Normal)   Pulse 61   Ht 5\' 3"  (1.6 m)   Wt 205 lb 9.6 oz (93.3 kg)   SpO2 96%   BMI 36.42 kg/m  Patient is very pleasant and in no acute distress. Very hard of hearing. Skin is warm and dry. Color is normal.  HEENT is unremarkable. Normocephalic/atraumatic. PERRL. Sclera are nonicteric. Neck is supple. No  masses. No JVD. Lungs are clear. Cardiac exam shows a regular rate and rhythm. Abdomen is soft. Extremities show trace edema.  She does have pain on palpation and movement of right shoulder. Gait and ROM are intact. No gross neurologic deficits noted.  Wt Readings from Last 3 Encounters:  12/28/16 205 lb 9.6 oz (93.3 kg)  06/29/16 207 lb 3.2 oz (94 kg)  11/09/15 202 lb 1.9 oz (91.7 kg)    LABORATORY DATA/PROCEDURES:  Lab Results  Component Value Date   WBC 6.3 11/09/2015   HGB 10.3 (L) 11/09/2015   HCT 31.0 (L) 11/09/2015   PLT 274 11/09/2015   GLUCOSE 108 (H) 11/09/2015   CHOL 136 11/09/2015   TRIG 194 (H) 11/09/2015   HDL 53 11/09/2015   LDLDIRECT 172.9 04/23/2013   LDLCALC 44 11/09/2015   ALT 11 11/09/2015   AST 17 11/09/2015   NA 139 11/09/2015   K  4.3 11/09/2015   CL 105 11/09/2015   CREATININE 1.13 (H) 11/09/2015   BUN 22 11/09/2015   CO2 25 11/09/2015   INR 1.2 02/13/2009   HGBA1C 6.6 (H) 04/23/2013    BNP (last 3 results) No results for input(s): PROBNP in the last 8760 hours.  Labs dated 11/24/16: Cholesterol 131, triglycerides 159, HDL 35, LDL 64. A1c 5.8%. Creatinine 1.3. Other chemistries and TSH normal.    Assessment / Plan: 1. HTN - BP is well controlled. Continue current therapy.   2. HLD - on statin therapy. Well controlled.  3. Minor nonobstructive CAD per cath with negative Myoview in 2011. Aortic atherosclerosis noted on prior CT. Continue risk factor modification  4. Edema. Improved.   See back in one year.

## 2016-12-28 ENCOUNTER — Ambulatory Visit (INDEPENDENT_AMBULATORY_CARE_PROVIDER_SITE_OTHER): Payer: Medicare Other | Admitting: Cardiology

## 2016-12-28 ENCOUNTER — Encounter: Payer: Self-pay | Admitting: Cardiology

## 2016-12-28 VITALS — BP 134/54 | HR 61 | Ht 63.0 in | Wt 205.6 lb

## 2016-12-28 DIAGNOSIS — E785 Hyperlipidemia, unspecified: Secondary | ICD-10-CM | POA: Diagnosis not present

## 2016-12-28 DIAGNOSIS — I10 Essential (primary) hypertension: Secondary | ICD-10-CM | POA: Diagnosis not present

## 2016-12-28 DIAGNOSIS — I700 Atherosclerosis of aorta: Secondary | ICD-10-CM

## 2016-12-28 NOTE — Patient Instructions (Addendum)
Continue your current therapy  I will see you in one year   

## 2017-01-01 NOTE — Addendum Note (Signed)
Addended by: Chana BodeGREEN, Larna Capelle L on: 01/01/2017 02:06 PM   Modules accepted: Orders

## 2017-02-21 ENCOUNTER — Ambulatory Visit: Payer: Medicare Other | Admitting: Podiatry

## 2017-02-27 ENCOUNTER — Other Ambulatory Visit: Payer: Self-pay | Admitting: Cardiovascular Disease

## 2017-03-16 ENCOUNTER — Encounter: Payer: Self-pay | Admitting: Podiatry

## 2017-03-16 ENCOUNTER — Ambulatory Visit (INDEPENDENT_AMBULATORY_CARE_PROVIDER_SITE_OTHER): Payer: Medicare Other | Admitting: Podiatry

## 2017-03-16 DIAGNOSIS — M79676 Pain in unspecified toe(s): Secondary | ICD-10-CM | POA: Diagnosis not present

## 2017-03-16 DIAGNOSIS — B351 Tinea unguium: Secondary | ICD-10-CM

## 2017-03-16 DIAGNOSIS — Q828 Other specified congenital malformations of skin: Secondary | ICD-10-CM

## 2017-03-16 NOTE — Progress Notes (Signed)
Complaint:  Visit Type: Patient returns to my office for continued preventative foot care services. Complaint: Patient states" my nails have grown long and thick and become painful to walk and wear shoes"  The patient presents for preventative foot care services. No changes to ROS.  Painful callus both feet.  Podiatric Exam: Vascular: dorsalis pedis and posterior tibial pulses are palpable bilateral. Capillary return is immediate. Temperature gradient is WNL. Skin turgor WNL  Sensorium: Normal Semmes Weinstein monofilament test. Normal tactile sensation bilaterally. Nail Exam: Pt has thick disfigured discolored nails with subungual debris noted bilateral entire nail hallux through fifth toenails Ulcer Exam: There is no evidence of ulcer or pre-ulcerative changes or infection. Orthopedic Exam: Muscle tone and strength are WNL. No limitations in general ROM. No crepitus or effusions noted. Foot type and digits show no abnormalities. Bony prominences are unremarkable. Skin:  Porokeratosis sub 3 B/L and sub 5th right. No infection or ulcers  Diagnosis:  Onychomycosis, , Pain in right toe, pain in left toes   Porokeratosis  B/L  Treatment & Plan Procedures and Treatment: Consent by patient was obtained for treatment procedures. The patient understood the discussion of treatment and procedures well. All questions were answered thoroughly reviewed. Debridement of mycotic and hypertrophic toenails, 1 through 5 bilateral and clearing of subungual debris. No ulceration, no infection noted. Debride porokeratosis Return Visit-Office Procedure: Patient instructed to return to the office for a follow up visit 3 months for continued evaluation and treatment.    Caris Cerveny DPM 

## 2017-04-26 ENCOUNTER — Other Ambulatory Visit: Payer: Self-pay | Admitting: Cardiology

## 2017-05-25 ENCOUNTER — Ambulatory Visit: Payer: Medicare Other | Admitting: Podiatry

## 2017-06-08 ENCOUNTER — Ambulatory Visit (INDEPENDENT_AMBULATORY_CARE_PROVIDER_SITE_OTHER): Payer: Medicare Other | Admitting: Podiatry

## 2017-06-08 ENCOUNTER — Encounter: Payer: Self-pay | Admitting: Podiatry

## 2017-06-08 DIAGNOSIS — M79676 Pain in unspecified toe(s): Secondary | ICD-10-CM | POA: Diagnosis not present

## 2017-06-08 DIAGNOSIS — Q828 Other specified congenital malformations of skin: Secondary | ICD-10-CM | POA: Diagnosis not present

## 2017-06-08 DIAGNOSIS — B351 Tinea unguium: Secondary | ICD-10-CM

## 2017-06-08 NOTE — Progress Notes (Signed)
Complaint:  Visit Type: Patient returns to my office for continued preventative foot care services. Complaint: Patient states" my nails have grown long and thick and become painful to walk and wear shoes"  The patient presents for preventative foot care services. No changes to ROS.  Painful callus both feet.  Podiatric Exam: Vascular: dorsalis pedis and posterior tibial pulses are palpable bilateral. Capillary return is immediate. Temperature gradient is WNL. Skin turgor WNL  Sensorium: Normal Semmes Weinstein monofilament test. Normal tactile sensation bilaterally. Nail Exam: Pt has thick disfigured discolored nails with subungual debris noted bilateral entire nail hallux through fifth toenails Ulcer Exam: There is no evidence of ulcer or pre-ulcerative changes or infection. Orthopedic Exam: Muscle tone and strength are WNL. No limitations in general ROM. No crepitus or effusions noted. Foot type and digits show no abnormalities. Bony prominences are unremarkable. Skin:  Porokeratosis sub 3 B/L and sub 5th right. No infection or ulcers  Diagnosis:  Onychomycosis, , Pain in right toe, pain in left toes   Porokeratosis  B/L  Treatment & Plan Procedures and Treatment: Consent by patient was obtained for treatment procedures. The patient understood the discussion of treatment and procedures well. All questions were answered thoroughly reviewed. Debridement of mycotic and hypertrophic toenails, 1 through 5 bilateral and clearing of subungual debris. No ulceration, no infection noted. Debride porokeratosis Return Visit-Office Procedure: Patient instructed to return to the office for a follow up visit 3 months for continued evaluation and treatment.    Helane GuntherGregory Leeanne Butters DPM

## 2017-09-07 ENCOUNTER — Ambulatory Visit: Payer: Medicare Other | Admitting: Podiatry

## 2017-09-18 ENCOUNTER — Other Ambulatory Visit: Payer: Self-pay | Admitting: Cardiology

## 2017-09-20 ENCOUNTER — Other Ambulatory Visit: Payer: Self-pay | Admitting: Cardiovascular Disease

## 2017-09-28 ENCOUNTER — Encounter: Payer: Self-pay | Admitting: Podiatry

## 2017-09-28 ENCOUNTER — Ambulatory Visit (INDEPENDENT_AMBULATORY_CARE_PROVIDER_SITE_OTHER): Payer: Medicare Other | Admitting: Podiatry

## 2017-09-28 DIAGNOSIS — B351 Tinea unguium: Secondary | ICD-10-CM | POA: Diagnosis not present

## 2017-09-28 DIAGNOSIS — M79676 Pain in unspecified toe(s): Secondary | ICD-10-CM | POA: Diagnosis not present

## 2017-09-28 DIAGNOSIS — Q828 Other specified congenital malformations of skin: Secondary | ICD-10-CM

## 2017-09-28 NOTE — Progress Notes (Signed)
Complaint:  Visit Type: Patient returns to my office for continued preventative foot care services. Complaint: Patient states" my nails have grown long and thick and become painful to walk and wear shoes"  The patient presents for preventative foot care services. No changes to ROS.  Painful callus both feet.  Podiatric Exam: Vascular: dorsalis pedis and posterior tibial pulses are palpable bilateral. Capillary return is immediate. Temperature gradient is WNL. Skin turgor WNL  Sensorium: Normal Semmes Weinstein monofilament test. Normal tactile sensation bilaterally. Nail Exam: Pt has thick disfigured discolored nails with subungual debris noted bilateral entire nail hallux through fifth toenails Ulcer Exam: There is no evidence of ulcer or pre-ulcerative changes or infection. Orthopedic Exam: Muscle tone and strength are WNL. No limitations in general ROM. No crepitus or effusions noted. Foot type and digits show no abnormalities. Bony prominences are unremarkable. Skin:  Porokeratosis sub 3 B/L and sub 1 right. No infection or ulcers  Diagnosis:  Onychomycosis, , Pain in right toe, pain in left toes   Porokeratosis  B/L  Treatment & Plan Procedures and Treatment: Consent by patient was obtained for treatment procedures. The patient understood the discussion of treatment and procedures well. All questions were answered thoroughly reviewed. Debridement of mycotic and hypertrophic toenails, 1 through 5 bilateral and clearing of subungual debris. No ulceration, no infection noted. Debride porokeratosis Return Visit-Office Procedure: Patient instructed to return to the office for a follow up visit 3 months for continued evaluation and treatment.    Helane GuntherGregory Jionni Helming DPM

## 2017-09-29 ENCOUNTER — Emergency Department (HOSPITAL_COMMUNITY): Payer: Medicare Other

## 2017-09-29 ENCOUNTER — Encounter (HOSPITAL_COMMUNITY): Payer: Self-pay

## 2017-09-29 ENCOUNTER — Inpatient Hospital Stay (HOSPITAL_COMMUNITY)
Admission: EM | Admit: 2017-09-29 | Discharge: 2017-10-01 | DRG: 292 | Disposition: A | Discharge status: Home Health | Payer: Medicare Other | Attending: Internal Medicine | Admitting: Internal Medicine

## 2017-09-29 DIAGNOSIS — R0789 Other chest pain: Secondary | ICD-10-CM | POA: Diagnosis present

## 2017-09-29 DIAGNOSIS — I313 Pericardial effusion (noninflammatory): Secondary | ICD-10-CM | POA: Diagnosis present

## 2017-09-29 DIAGNOSIS — R110 Nausea: Secondary | ICD-10-CM | POA: Diagnosis not present

## 2017-09-29 DIAGNOSIS — Z79899 Other long term (current) drug therapy: Secondary | ICD-10-CM

## 2017-09-29 DIAGNOSIS — I110 Hypertensive heart disease with heart failure: Secondary | ICD-10-CM | POA: Diagnosis not present

## 2017-09-29 DIAGNOSIS — I5033 Acute on chronic diastolic (congestive) heart failure: Secondary | ICD-10-CM | POA: Diagnosis not present

## 2017-09-29 DIAGNOSIS — Z9049 Acquired absence of other specified parts of digestive tract: Secondary | ICD-10-CM

## 2017-09-29 DIAGNOSIS — E039 Hypothyroidism, unspecified: Secondary | ICD-10-CM

## 2017-09-29 DIAGNOSIS — I2510 Atherosclerotic heart disease of native coronary artery without angina pectoris: Secondary | ICD-10-CM | POA: Diagnosis not present

## 2017-09-29 DIAGNOSIS — I252 Old myocardial infarction: Secondary | ICD-10-CM

## 2017-09-29 DIAGNOSIS — E785 Hyperlipidemia, unspecified: Secondary | ICD-10-CM | POA: Diagnosis not present

## 2017-09-29 DIAGNOSIS — Z87891 Personal history of nicotine dependence: Secondary | ICD-10-CM | POA: Diagnosis not present

## 2017-09-29 DIAGNOSIS — Z7982 Long term (current) use of aspirin: Secondary | ICD-10-CM | POA: Diagnosis not present

## 2017-09-29 DIAGNOSIS — K219 Gastro-esophageal reflux disease without esophagitis: Secondary | ICD-10-CM | POA: Diagnosis not present

## 2017-09-29 DIAGNOSIS — J811 Chronic pulmonary edema: Secondary | ICD-10-CM

## 2017-09-29 DIAGNOSIS — J810 Acute pulmonary edema: Secondary | ICD-10-CM

## 2017-09-29 DIAGNOSIS — Z79890 Hormone replacement therapy (postmenopausal): Secondary | ICD-10-CM

## 2017-09-29 DIAGNOSIS — I700 Atherosclerosis of aorta: Secondary | ICD-10-CM | POA: Diagnosis present

## 2017-09-29 DIAGNOSIS — R079 Chest pain, unspecified: Secondary | ICD-10-CM | POA: Diagnosis present

## 2017-09-29 HISTORY — DX: Acute myocardial infarction, unspecified: I21.9

## 2017-09-29 HISTORY — DX: Hypothyroidism, unspecified: E03.9

## 2017-09-29 LAB — I-STAT TROPONIN, ED: TROPONIN I, POC: 0 ng/mL (ref 0.00–0.08)

## 2017-09-29 LAB — CBC
HCT: 30.5 % — ABNORMAL LOW (ref 36.0–46.0)
HEMATOCRIT: 30.4 % — AB (ref 36.0–46.0)
Hemoglobin: 9.8 g/dL — ABNORMAL LOW (ref 12.0–15.0)
Hemoglobin: 9.8 g/dL — ABNORMAL LOW (ref 12.0–15.0)
MCH: 28.5 pg (ref 26.0–34.0)
MCH: 28.9 pg (ref 26.0–34.0)
MCHC: 32.1 g/dL (ref 30.0–36.0)
MCHC: 32.2 g/dL (ref 30.0–36.0)
MCV: 88.7 fL (ref 78.0–100.0)
MCV: 89.7 fL (ref 78.0–100.0)
PLATELETS: 252 10*3/uL (ref 150–400)
Platelets: 251 10*3/uL (ref 150–400)
RBC: 3.44 MIL/uL — ABNORMAL LOW (ref 3.87–5.11)
RBC: 3.39 MIL/uL — AB (ref 3.87–5.11)
RDW: 14.3 % (ref 11.5–15.5)
RDW: 14.6 % (ref 11.5–15.5)
WBC: 4.6 10*3/uL (ref 4.0–10.5)
WBC: 4.6 10*3/uL (ref 4.0–10.5)

## 2017-09-29 LAB — BASIC METABOLIC PANEL
ANION GAP: 8 (ref 5–15)
BUN: 24 mg/dL — AB (ref 6–20)
CALCIUM: 9.1 mg/dL (ref 8.9–10.3)
CO2: 23 mmol/L (ref 22–32)
CREATININE: 1.18 mg/dL — AB (ref 0.44–1.00)
Chloride: 104 mmol/L (ref 101–111)
GFR calc Af Amer: 47 mL/min — ABNORMAL LOW (ref >60)
GFR, EST NON AFRICAN AMERICAN: 41 mL/min — AB (ref >60)
GLUCOSE: 134 mg/dL — AB (ref 65–99)
Potassium: 4.4 mmol/L (ref 3.5–5.1)
Sodium: 135 mmol/L (ref 135–145)

## 2017-09-29 LAB — CREATININE, SERUM
Creatinine, Ser: 1.2 mg/dL — ABNORMAL HIGH (ref 0.44–1.00)
GFR calc non Af Amer: 40 mL/min — ABNORMAL LOW (ref >60)
GFR, EST AFRICAN AMERICAN: 46 mL/min — AB (ref >60)

## 2017-09-29 LAB — BRAIN NATRIURETIC PEPTIDE: B Natriuretic Peptide: 109.5 pg/mL — ABNORMAL HIGH (ref 0.0–100.0)

## 2017-09-29 MED ORDER — LEVOTHYROXINE SODIUM 50 MCG PO TABS
50.0000 ug | ORAL_TABLET | Freq: Every day | ORAL | Status: DC
  Administered 2017-09-30 – 2017-10-01 (×2): 50 ug via ORAL
  Filled 2017-09-29 (×2): qty 1

## 2017-09-29 MED ORDER — HYDRALAZINE HCL 25 MG PO TABS
25.0000 mg | ORAL_TABLET | Freq: Three times a day (TID) | ORAL | Status: DC
  Administered 2017-09-30 – 2017-10-01 (×4): 25 mg via ORAL
  Filled 2017-09-29 (×4): qty 1

## 2017-09-29 MED ORDER — HEPARIN SODIUM (PORCINE) 5000 UNIT/ML IJ SOLN
5000.0000 [IU] | Freq: Three times a day (TID) | INTRAMUSCULAR | Status: DC
  Administered 2017-09-30 – 2017-10-01 (×6): 5000 [IU] via SUBCUTANEOUS
  Filled 2017-09-29 (×6): qty 1

## 2017-09-29 MED ORDER — ONDANSETRON HCL 4 MG/2ML IJ SOLN: 4.0000 mg | Freq: Four times a day (QID) | INTRAMUSCULAR | Status: DC | PRN

## 2017-09-29 MED ORDER — FUROSEMIDE 10 MG/ML IJ SOLN
20.0000 mg | Freq: Two times a day (BID) | INTRAMUSCULAR | Status: DC
  Administered 2017-09-30 (×2): 20 mg via INTRAVENOUS
  Filled 2017-09-29 (×2): qty 2

## 2017-09-29 MED ORDER — PANTOPRAZOLE SODIUM 40 MG PO TBEC
40.0000 mg | DELAYED_RELEASE_TABLET | Freq: Every day | ORAL | Status: DC
  Administered 2017-09-30 – 2017-10-01 (×2): 40 mg via ORAL
  Filled 2017-09-29 (×2): qty 1

## 2017-09-29 MED ORDER — POTASSIUM CHLORIDE CRYS ER 20 MEQ PO TBCR
20.0000 meq | EXTENDED_RELEASE_TABLET | Freq: Every day | ORAL | Status: DC
  Administered 2017-09-30 – 2017-10-01 (×2): 20 meq via ORAL
  Filled 2017-09-29 (×2): qty 1

## 2017-09-29 MED ORDER — ALBUTEROL SULFATE (2.5 MG/3ML) 0.083% IN NEBU
2.5000 mg | INHALATION_SOLUTION | Freq: Once | RESPIRATORY_TRACT | Status: DC
  Filled 2017-09-29: qty 3

## 2017-09-29 MED ORDER — HYDROCHLOROTHIAZIDE 12.5 MG PO CAPS
12.5000 mg | ORAL_CAPSULE | Freq: Every day | ORAL | Status: DC
  Filled 2017-09-29: qty 1

## 2017-09-29 MED ORDER — ALBUTEROL SULFATE (2.5 MG/3ML) 0.083% IN NEBU
2.5000 mg | INHALATION_SOLUTION | RESPIRATORY_TRACT | Status: DC | PRN
  Administered 2017-09-30: 2.5 mg via RESPIRATORY_TRACT
  Filled 2017-09-29: qty 3

## 2017-09-29 MED ORDER — ASPIRIN EC 325 MG PO TBEC
325.0000 mg | DELAYED_RELEASE_TABLET | Freq: Every day | ORAL | Status: DC
  Administered 2017-09-30 – 2017-10-01 (×2): 325 mg via ORAL
  Filled 2017-09-29 (×2): qty 1

## 2017-09-29 MED ORDER — ACETAMINOPHEN 325 MG PO TABS
650.0000 mg | ORAL_TABLET | ORAL | Status: DC | PRN
  Administered 2017-09-30: 650 mg via ORAL
  Filled 2017-09-29: qty 2

## 2017-09-29 MED ORDER — AMLODIPINE BESYLATE 10 MG PO TABS
10.0000 mg | ORAL_TABLET | Freq: Every day | ORAL | Status: DC
  Administered 2017-09-30 (×2): 10 mg via ORAL
  Filled 2017-09-29 (×2): qty 1

## 2017-09-29 MED ORDER — NITROGLYCERIN 0.4 MG SL SUBL: 0.4000 mg | SUBLINGUAL_TABLET | SUBLINGUAL | Status: DC | PRN

## 2017-09-29 MED ORDER — ROSUVASTATIN CALCIUM 20 MG PO TABS
20.0000 mg | ORAL_TABLET | Freq: Every day | ORAL | Status: DC
  Administered 2017-09-30 – 2017-10-01 (×2): 20 mg via ORAL
  Filled 2017-09-29 (×2): qty 1

## 2017-09-29 MED ORDER — FUROSEMIDE 10 MG/ML IJ SOLN
40.0000 mg | Freq: Once | INTRAMUSCULAR | Status: AC
  Administered 2017-09-29: 40 mg via INTRAVENOUS
  Filled 2017-09-29: qty 4

## 2017-09-29 MED ORDER — RAMIPRIL 2.5 MG PO CAPS
10.0000 mg | ORAL_CAPSULE | Freq: Every day | ORAL | Status: DC
  Administered 2017-09-30: 10 mg via ORAL
  Filled 2017-09-29: qty 4

## 2017-09-29 MED ORDER — NITROGLYCERIN 2 % TD OINT
1.0000 [in_us] | TOPICAL_OINTMENT | Freq: Four times a day (QID) | TRANSDERMAL | Status: DC
  Administered 2017-09-30 – 2017-10-01 (×5): 1 [in_us] via TOPICAL
  Filled 2017-09-29: qty 30

## 2017-09-29 MED ORDER — MORPHINE SULFATE (PF) 4 MG/ML IV SOLN: 4.0000 mg | INTRAVENOUS | Status: DC | PRN

## 2017-09-29 MED ORDER — HYDRALAZINE HCL 20 MG/ML IJ SOLN: 10.0000 mg | Freq: Three times a day (TID) | INTRAMUSCULAR | Status: DC | PRN

## 2017-09-29 NOTE — ED Triage Notes (Signed)
Pt. From home with reports of generalized weakness with CP. Pt. Given 324 aspirin and 1 nitro with no relief. Pt. Has hx of 2 MI's. Pt. Also reports bilateral leg swelling.

## 2017-09-29 NOTE — ED Notes (Signed)
Admitting MD at bedside deciding if pt. Would like to stay.

## 2017-09-29 NOTE — ED Notes (Signed)
Pt. To XRAY via stretcher. 

## 2017-09-29 NOTE — ED Provider Notes (Signed)
[facility] 3[facility] CHF Provider Note   CSN: [id number] Arrival date & time:        History   Chief Complaint Chief Complaint  Patient presents with  . Chest Pain    HPI Alyssa Graves is a 84 y.o. female.  The history is provided by the patient and a caregiver.  Chest Pain   This is a new problem. The current episode started 3 to 5 hours ago. The problem has been resolved. The pain is associated with exertion. The pain is present in the lateral region. The pain is at a severity of 3/10. The pain is mild. The quality of the pain is described as pressure-like. The pain does not radiate. Associated symptoms include lower extremity edema and shortness of breath. Pertinent negatives include no abdominal pain, no back pain, no cough, no diaphoresis, no fever, no hemoptysis, no irregular heartbeat, no malaise/fatigue, no near-syncope, no orthopnea, no palpitations, no sputum production, no syncope and no vomiting. She has tried nothing for the symptoms. The treatment provided mild relief.  Her past medical history is significant for CAD.  Pertinent negatives for past medical history include no CHF and no seizures.    Past Medical History:  Diagnosis Date  . Arthritis   . GERD (gastroesophageal reflux disease)   . Glucose intolerance (impaired glucose tolerance)   . Heart attack (HCC)    x2  . HTN (hypertension)   . Hypercholesterolemia   . Hypothyroid     Patient Active Problem List   Diagnosis Date Noted  . Edema of leg 05/11/2015  . Chest pain 04/18/2015  . Abdominal pain, acute 04/18/2015  . HTN (hypertension) 04/18/2015  . Glucose intolerance (impaired glucose tolerance) 04/18/2015  . Abdominal pain, epigastric 04/18/2015  . ANEMIA, NORMOCYTIC, CHRONIC 02/10/2009  . BREAST PAIN, RIGHT 10/14/2008  . DIAPHORESIS 04/17/2007  . INFECTION, LOCAL SKIN/SUBCUTANEOUS TISSUE NOS 02/19/2007  . Dyslipidemia 01/14/2007  . LOSS, SENSORINEURAL HEARING, BILAT  01/14/2007  . Essential hypertension 01/14/2007  . Coronary atherosclerosis 01/14/2007  . VAGINITIS, ATROPHIC 01/14/2007  . OSTEITIS CONDENSANS 01/14/2007  . ABDOMINAL PAIN, LEFT LOWER QUADRANT 11/16/2006    Past Surgical History:  Procedure Laterality Date  . CHOLECYSTECTOMY    . FOOT SURGERY    . hammer toes    . UMBILICAL HERNIA REPAIR       OB History   None      Home Medications    Prior to Admission medications   Medication Sig Start Date End Date Taking? Authorizing Provider  amLODipine (NORVASC) 10 MG tablet Take 10 mg by mouth at bedtime.  01/07/15  Yes [provider]  aspirin EC 81 MG tablet Take 81 mg by mouth daily.   Yes [provider]  Calcium Carbonate (CALCIUM 600 PO) Take 600 mg by mouth 2 (two) times daily.    Yes [provider]  Cholecalciferol (VITAMIN D) 2000 UNITS tablet Take 2,000 Units by mouth daily at 12 noon.   Yes [provider]  hydrALAZINE (APRESOLINE) 25 MG tablet TAKE 1 TABLET (25 MG TOTAL) BY MOUTH THREE TIMES DAILY. 09/20/17  Yes [place], Peter M, MD  hydrochlorothiazide (MICROZIDE) 12.5 MG capsule TAKE 1 CAPSULE BY MOUTH DAILY. 09/18/17  Yes [place], Peter M, MD  levothyroxine (SYNTHROID, LEVOTHROID) 50 MCG tablet Take 50 mcg by mouth daily before breakfast.   Yes [provider]  nitroGLYCERIN (NITROSTAT) 0.4 MG SL tablet Place 1 tablet (0.4 mg total) under the tongue every 5 (five)  minutes as needed for chest pain. 09/15/13  Yes Rosalio Macadamia, NP  omeprazole (PRILOSEC) 20 MG capsule TAKE 1 CAPSULE BY MOUTH ONCE DAILY 04/26/17  Yes [place], Peter M, MD  potassium chloride SA (K-DUR,KLOR-CON) 20 MEQ tablet TAKE 1 TABLET (20 MEQ TOTAL) BY MOUTH TWO TIMES DAILY. 04/26/17  Yes [place], Peter M, MD  ramipril (ALTACE) 10 MG capsule TAKE 1 CAPSULE (10 MG TOTAL) BY MOUTH DAILY. 04/26/17  Yes [place], Peter M, MD  rosuvastatin (CRESTOR) 20 MG tablet TAKE 1 TABLET BY MOUTH DAILY. 09/18/17  Yes [place], Peter M, MD     Family History Family History  Problem Relation Age of Onset  . Heart attack Neg Hx     Social History Social History   Tobacco Use  . Smoking status: Former Games developer  . Smokeless tobacco: Never Used  Substance Use Topics  . Alcohol use: No    Alcohol/week: 0.0 oz  . Drug use: No     Allergies   Patient has no known allergies.   Review of Systems Review of Systems  Constitutional: Negative for chills, diaphoresis, fever and malaise/fatigue.  HENT: Negative for ear pain and sore throat.   Eyes: Negative for pain and visual disturbance.  Respiratory: Positive for shortness of breath. Negative for cough, hemoptysis and sputum production.   Cardiovascular: Positive for chest pain and leg swelling. Negative for palpitations, orthopnea, syncope and near-syncope.  Gastrointestinal: Negative for abdominal pain and vomiting.  Genitourinary: Negative for dysuria and hematuria.  Musculoskeletal: Negative for arthralgias and back pain.  Skin: Negative for color change and rash.  Neurological: Negative for seizures and syncope.  All other systems reviewed and are negative.    Physical Exam Updated Vital Signs  ED Triage Vitals  Enc Vitals Group     BP 09/29/17 2038 (!) 145/63     Pulse Rate 09/29/17 2038 63     Resp 09/29/17 2038 (!) 24     Temp 09/29/17 2033 97.9 F (36.6 C)     Temp Source 09/29/17 2033 Oral     SpO2 09/29/17 2038 94 %     Weight --      Height --      Head Circumference --      Peak Flow --      Pain Score --      Pain Loc --      Pain Edu? --      Excl. in GC? --     Physical Exam  Constitutional: She appears well-developed and well-nourished. No distress.  HENT:  Head: Normocephalic and atraumatic.  Eyes: Conjunctivae are normal.  Neck: Normal range of motion. Neck supple.  Cardiovascular: Normal rate, regular rhythm, intact distal pulses and normal pulses.  No murmur heard. Pulmonary/Chest: Tachypnea noted. No respiratory distress.  She has decreased breath sounds in the right lower field and the left lower field. She has rales in the right lower field and the left lower field.  Abdominal: Soft. There is no tenderness.  Musculoskeletal: Normal range of motion.       Right lower leg: She exhibits edema (3+ pitting edema b/l).       Left lower leg: She exhibits edema (3+).  Neurological: She is alert.  Skin: Skin is warm and dry. Capillary refill takes less than 2 seconds.  Psychiatric: She has a normal mood and affect.  Nursing note and vitals reviewed.    ED Treatments / Results  Labs (all labs ordered are listed, but only  abnormal results are displayed) Labs Reviewed  BASIC METABOLIC PANEL - Abnormal; Notable for the following components:      Result Value   Glucose, Bld 134 (*)    BUN 24 (*)    Creatinine, Ser 1.18 (*)    GFR calc non Af Amer 41 (*)    GFR calc Af Amer 47 (*)    All other components within normal limits  CBC - Abnormal; Notable for the following components:   RBC 3.44 (*)    Hemoglobin 9.8 (*)    HCT 30.5 (*)    All other components within normal limits  BRAIN NATRIURETIC PEPTIDE - Abnormal; Notable for the following components:   B Natriuretic Peptide 109.5 (*)    All other components within normal limits  CBC - Abnormal; Notable for the following components:   RBC 3.39 (*)    Hemoglobin 9.8 (*)    HCT 30.4 (*)    All other components within normal limits  CREATININE, SERUM - Abnormal; Notable for the following components:   Creatinine, Ser 1.20 (*)    GFR calc non Af Amer 40 (*)    GFR calc Af Amer 46 (*)    All other components within normal limits  TROPONIN I  TROPONIN I  BASIC METABOLIC PANEL  I-STAT TROPONIN, ED    EKG EKG Interpretation  Date/Time:  Saturday September 29 2017 20:30:45 EDT Ventricular Rate:  64 PR Interval:    QRS Duration: 107 QT Interval:  448 QTC Calculation: 463 R Axis:   33 Text Interpretation:  Sinus rhythm Ventricular premature complex  Abnormal T, consider ischemia, lateral leads Confirmed by Blane Ohara [phone]) on 09/29/2017 9:23:57 PM   Radiology Dg Chest 2 View  Result Date: 09/29/2017 CLINICAL DATA:  Generalized weakness with chest pain. EXAM: CHEST - 2 VIEW COMPARISON:  04/18/2015 FINDINGS: Stable cardiomegaly with aortic atherosclerosis. Mild interstitial edema with left basilar atelectasis. No effusion or pneumothorax. No pulmonary consolidation. Osseous elements are stable with degenerative change about both shoulders and dorsal spine. IMPRESSION: Stable cardiomegaly with aortic atherosclerosis and mild interstitial edema. Left basilar atelectasis. Electronically Signed   By: Tollie Eth M.D.   On: 09/29/2017 21:11    Procedures Procedures (including critical care time)  Medications Ordered in ED Medications  acetaminophen (TYLENOL) tablet 650 mg (has no administration in time range)  ondansetron (ZOFRAN) injection 4 mg (has no administration in time range)  heparin injection 5,000 Units (has no administration in time range)  aspirin EC tablet 325 mg (has no administration in time range)  hydrALAZINE (APRESOLINE) injection 10 mg (has no administration in time range)  nitroGLYCERIN (NITROSTAT) SL tablet 0.4 mg (has no administration in time range)  morphine 4 MG/ML injection 4 mg (has no administration in time range)  furosemide (LASIX) injection 20 mg (has no administration in time range)  albuterol (PROVENTIL) (2.5 MG/3ML) 0.083% nebulizer solution 2.5 mg (has no administration in time range)  albuterol (PROVENTIL) (2.5 MG/3ML) 0.083% nebulizer solution 2.5 mg (2.5 mg Nebulization Not Given 09/29/17 2200)  nitroGLYCERIN (NITROGLYN) 2 % ointment 1 inch (has no administration in time range)  furosemide (LASIX) injection 40 mg (40 mg Intravenous Given 09/29/17 2235)       EMERGENCY DEPARTMENT [place] CARDIAC EXAM "Study: Limited Ultrasound of the Heart and Pericardium"  INDICATIONS:Chest pain and Dyspnea Multiple  views of the heart and pericardium were obtained in real-time with a multi-frequency probe.  PERFORMED OZ:HYQMVH IMAGES ARCHIVED?: Yes LIMITATIONS:  Body habitus VIEWS USED: Parasternal  short axis and Apical 4 chamber  INTERPRETATION: Pericardial effusion present and Decreased contractility  Initial Impression / Assessment and Plan / ED Course  I have reviewed the triage vital signs and the nursing notes.  Pertinent labs & imaging results that were available during my care of the patient were reviewed by me and considered in my medical decision making (see chart for details).     CHELSI WARR is a 84 year old female with history of hypertension, CAD who presents to the ED with chest pain, shortness of breath.  Patient overall unremarkable vitals.  No fever.  Patient according to family members appear to be having some chest pain shortness of breath while walking earlier today.  Patient denies any current chest pain at this time.  Complains of some right arm pain.  Patient states that she had a cardiac workup about 9 years ago.  Family is able to confirm this.  She has some obstructive disease but no intervention was done at that time.  No known history of heart failure.  Former smoker and no history of lung disease.  Patient on exam appears to have some increased work of breathing, 3+ pitting edema bilaterally, rales on exam as well.  Bedside echocardiogram shows some small pericardial effusion and some decreased contractility.  EKG shows sinus rhythm with no signs of ischemic changes.  EKG is unchanged from prior.  Concern for ACS versus heart failure exacerbation.  Patient does not have any DVT or PE risk factors.  May possibly have some undiagnosed COPD as well.  Will obtain labs including BMP, troponin.  Patient with no significant anemia, electrolyte abnormality.  Creatinine slightly elevated.  Patient with normal troponin.  Slightly elevated BNP.  Concern for likely volume overload given  chest x-ray findings as well.  No obvious pneumonia or pneumothorax.  Patient to be admitted for ACS rule out, likely new heart failure and possibly may be mild COPD.  Patient admitted to medicine for further care. Pt in stable condition.  Final Clinical Impressions(s) / ED Diagnoses   Final diagnoses:  Pulmonary edema    ED Discharge Orders    None       Virgina Norfolk, DO 09/29/17 2308    Blane Ohara, MD 09/30/17 [phone]

## 2017-09-29 NOTE — Progress Notes (Signed)
ED RN called 2x .  I called her back at [phone] and no answer to get report.

## 2017-09-29 NOTE — H&P (Addendum)
[facility] History and Physical  Alyssa JensenGirtha B Graves ZOX:[id number] DOB: 04-25-1930 DOA: 09/29/2017  Referring physician:  PCP: [name], Scott, MD   Chief Complaint: sob  HPI: Alyssa JensenGirtha B Pizzini is a 84 y.o. female history significant for myocardial infarction x2, hypertension and hypothyroidism presents to the emergency room with a chief complaint of shortness of breath and chest pain.  This problem started acutely today.  Patient had dyspnea on exertion and began clutching her chest.  Family came alarmed.  Urged patient to the emergency room.  She was initially resistant.  After some time patient was able to be convinced.  Brought to the emergency room for further evaluation.  ED course: Initial troponin negative.  Chest x-ray showed some mild interstitial edema.  Patient with O2 saturation in low 90s on room air.  Labored breathing.  Hospitalist consulted for admission.  Patient tried to leave the emergency room.  Requested bedside ultrasound from EDP.  Possible pericardial effusion seen on bedside ultrasound.  Patient agreed to stay.   Review of Systems:  As per HPI otherwise 10 point review of systems negative.    Past Medical History:  Diagnosis Date  . Arthritis   . GERD (gastroesophageal reflux disease)   . Glucose intolerance (impaired glucose tolerance)   . Heart attack (HCC)    x2  . HTN (hypertension)   . Hypercholesterolemia   . Hypothyroid    Past Surgical History:  Procedure Laterality Date  . CHOLECYSTECTOMY    . FOOT SURGERY    . hammer toes    . UMBILICAL HERNIA REPAIR     Social History:  reports that she has quit smoking. She has never used smokeless tobacco. She reports that she does not drink alcohol or use drugs.  No Known Allergies  History reviewed. No pertinent family history.   Prior to Admission medications   Medication Sig Start Date End Date Taking? Authorizing Provider  amLODipine (NORVASC) 10 MG tablet Take 10 mg by mouth at bedtime.   01/07/15  Yes [provider]  aspirin EC 81 MG tablet Take 81 mg by mouth daily.   Yes [provider]  Calcium Carbonate (CALCIUM 600 PO) Take 600 mg by mouth 2 (two) times daily.    Yes [provider]  Cholecalciferol (VITAMIN D) 2000 UNITS tablet Take 2,000 Units by mouth daily at 12 noon.   Yes [provider]  hydrALAZINE (APRESOLINE) 25 MG tablet TAKE 1 TABLET (25 MG TOTAL) BY MOUTH THREE TIMES DAILY. 09/20/17  Yes [place], Peter M, MD  hydrochlorothiazide (MICROZIDE) 12.5 MG capsule TAKE 1 CAPSULE BY MOUTH DAILY. 09/18/17  Yes [place], Peter M, MD  levothyroxine (SYNTHROID, LEVOTHROID) 50 MCG tablet Take 50 mcg by mouth daily before breakfast.   Yes [provider]  nitroGLYCERIN (NITROSTAT) 0.4 MG SL tablet Place 1 tablet (0.4 mg total) under the tongue every 5 (five) minutes as needed for chest pain. 09/15/13  Yes Rosalio MacadamiaGerhardt, Lori C, NP  omeprazole (PRILOSEC) 20 MG capsule TAKE 1 CAPSULE BY MOUTH ONCE DAILY 04/26/17  Yes [place], Peter M, MD  potassium chloride SA (K-DUR,KLOR-CON) 20 MEQ tablet TAKE 1 TABLET (20 MEQ TOTAL) BY MOUTH TWO TIMES DAILY. 04/26/17  Yes [place], Peter M, MD  ramipril (ALTACE) 10 MG capsule TAKE 1 CAPSULE (10 MG TOTAL) BY MOUTH DAILY. 04/26/17  Yes [place], Peter M, MD  rosuvastatin (CRESTOR) 20 MG tablet TAKE 1 TABLET BY MOUTH DAILY. 09/18/17  Yes [place], Peter M, MD   Physical Exam: Vitals:  09/29/17 2033 09/29/17 2038 09/29/17 2100 09/29/17 2115  BP:  (!) 145/63 (!) 150/59 (!) 142/57  Pulse:  63 61 (!) 56  Resp:  (!) 24 (!) 21 20  Temp: 97.9 F (36.6 C)     TempSrc: Oral     SpO2:  94% 96% 94%    Wt Readings from Last 3 Encounters:  12/28/16 93.3 kg (205 lb 9.6 oz)  06/29/16 94 kg (207 lb 3.2 oz)  11/09/15 91.7 kg (202 lb 1.9 oz)    General:  Appears calm and comfortable; A&Ox3 Eyes:  PERRL, EOMI, normal lids, iris ENT:  grossly normal hearing, lips & tongue Neck:  no LAD, masses or thyromegaly Cardiovascular:  RRR,  no m/r/g. LE edema.  Respiratory: diff wheeze, increased work of breathing, tachypnea Abdomen:  soft, ntnd Skin:  no rash or induration seen on limited exam Musculoskeletal:  grossly normal tone BUE/BLE Psychiatric:  grossly normal mood and affect, speech fluent and appropriate Neurologic:  CN 2-12 grossly intact, moves all extremities in coordinated fashion.          Labs on Admission:  Basic Metabolic Panel: Recent Labs  Lab 09/29/17 2034  NA 135  K 4.4  CL 104  CO2 23  GLUCOSE 134*  BUN 24*  CREATININE 1.18*  CALCIUM 9.1   Liver Function Tests: No results for input(s): AST, ALT, ALKPHOS, BILITOT, PROT, ALBUMIN in the last 168 hours. No results for input(s): LIPASE, AMYLASE in the last 168 hours. No results for input(s): AMMONIA in the last 168 hours. CBC: Recent Labs  Lab 09/29/17 2034  WBC 4.6  HGB 9.8*  HCT 30.5*  MCV 88.7  PLT 252   Cardiac Enzymes: No results for input(s): CKTOTAL, CKMB, CKMBINDEX, TROPONINI in the last 168 hours.  BNP (last 3 results) No results for input(s): BNP in the last 8760 hours.  ProBNP (last 3 results) No results for input(s): PROBNP in the last 8760 hours.   Creatinine clearance cannot be calculated (Unknown ideal weight.)  CBG: No results for input(s): GLUCAP in the last 168 hours.  Radiological Exams on Admission: Dg Chest 2 View  Result Date: 09/29/2017 CLINICAL DATA:  Generalized weakness with chest pain. EXAM: CHEST - 2 VIEW COMPARISON:  04/18/2015 FINDINGS: Stable cardiomegaly with aortic atherosclerosis. Mild interstitial edema with left basilar atelectasis. No effusion or pneumothorax. No pulmonary consolidation. Osseous elements are stable with degenerative change about both shoulders and dorsal spine. IMPRESSION: Stable cardiomegaly with aortic atherosclerosis and mild interstitial edema. Left basilar atelectasis. Electronically Signed   By: Tollie Eth M.D.   On: 09/29/2017 21:11    EKG: Independently reviewed.  NSR with PVCs, no stemi.  Assessment/Plan Active Problems:   Chest pain   Pulm Edema/CP - serial trop ordered, initial neg - prn EKG CP - prn moprhine CP - prn ntg cp - nitro patch - lasix bid - asa in ED and QD - ECHO ordered for AM - tele bed, cardiac monitoring - ambien for sleep prn - zofran prn for nausea  Hypertension When necessary hydralazine 10 mg IV as needed for severe blood pressure Cont norvasc and hydralalzine & hctz & acei Cont kdur  Hypothyroidism Cont OP synthroid 50 mcg qd No signs of hyper or hypothyroidism  GERD  Cont PPI  Hyperlipidemia Continue statin   Code Status: FC  DVT Prophylaxis: heparin Family Communication: dgtr at bedside; other dgtr [phone] Sid Falcon Disposition Plan: Pending Improvement  Status: inpt tele  Haydee Salter, MD Family Medicine  [facility] [email or web address] Password [email or web address]

## 2017-09-30 ENCOUNTER — Inpatient Hospital Stay (HOSPITAL_COMMUNITY): Payer: Medicare Other

## 2017-09-30 ENCOUNTER — Other Ambulatory Visit: Payer: Self-pay

## 2017-09-30 DIAGNOSIS — R0789 Other chest pain: Secondary | ICD-10-CM | POA: Diagnosis not present

## 2017-09-30 DIAGNOSIS — I110 Hypertensive heart disease with heart failure: Secondary | ICD-10-CM | POA: Diagnosis not present

## 2017-09-30 DIAGNOSIS — R079 Chest pain, unspecified: Secondary | ICD-10-CM

## 2017-09-30 LAB — BASIC METABOLIC PANEL
ANION GAP: 13 (ref 5–15)
BUN: 20 mg/dL (ref 6–20)
CO2: 24 mmol/L (ref 22–32)
Calcium: 9.2 mg/dL (ref 8.9–10.3)
Chloride: 100 mmol/L — ABNORMAL LOW (ref 101–111)
Creatinine, Ser: 1.15 mg/dL — ABNORMAL HIGH (ref 0.44–1.00)
GFR, EST AFRICAN AMERICAN: 48 mL/min — AB (ref >60)
GFR, EST NON AFRICAN AMERICAN: 42 mL/min — AB (ref >60)
GLUCOSE: 94 mg/dL (ref 65–99)
Potassium: 3.6 mmol/L (ref 3.5–5.1)
Sodium: 137 mmol/L (ref 135–145)

## 2017-09-30 LAB — ECHOCARDIOGRAM COMPLETE
Height: 63 in
Weight: 3187.2 oz

## 2017-09-30 LAB — TROPONIN I
TROPONIN I: 0.03 ng/mL — AB (ref <0.03)
Troponin I: 0.03 ng/mL (ref <0.03)
Troponin I: 0.03 ng/mL (ref <0.03)
Troponin I: 0.04 ng/mL (ref <0.03)

## 2017-09-30 NOTE — Progress Notes (Signed)
Page to Dr Julio Sickssei-Bonsu  [facility] and Lasix? or hold home HCTZ while getting Lasix IV?

## 2017-09-30 NOTE — Progress Notes (Signed)
Dr Peter SwazilandJordan is the patient's cardiologist.   [address]  [phone]

## 2017-09-30 NOTE — Progress Notes (Signed)
Results for Lacey JensenLOCKLEAR, Shanyla B (MRN [id number]) as of 09/30/2017 01:41  Ref. Range 09/30/2017 00:36  Troponin I Latest Ref Range: <0.03 ng/mL 0.03 (HH)  MESSAGE SENT TO [facility]

## 2017-09-30 NOTE — Plan of Care (Signed)
  Problem: Activity: Goal: Risk for activity intolerance will decrease Outcome: Progressing   Problem: Safety: Goal: Ability to remain free from injury will improve Outcome: Progressing   

## 2017-09-30 NOTE — Progress Notes (Addendum)
CRITICAL VALUE ALERT  Critical Value:  Troponin 0.04  Date & Time Notied:  09/30/17 1:46 PM  Provider Notified: G.Osei-Bonsu,MD via text page.  Orders entered at 1745 by MD for follow up labs.

## 2017-09-30 NOTE — Progress Notes (Signed)
Called Bill RT, he will come give PRN neb Tx per wheeziness.

## 2017-09-30 NOTE — Progress Notes (Signed)
C/o bilateral leg cramps.  Given tylenol and unrelieved at 06:25, phleb in room, will wait to see result of BMP.

## 2017-09-30 NOTE — Progress Notes (Signed)
*  PRELIMINARY RESULTS* Echocardiogram 2D Echocardiogram has been performed.  Stacey DrainWhite, Andersen Mckiver J 09/30/2017, 2:48 PM

## 2017-09-30 NOTE — Progress Notes (Signed)
Patient resting comfortably during shift report. Denies complaints.  

## 2017-09-30 NOTE — Progress Notes (Signed)
[facility]                                                                              Patient Demographics  Alyssa Graves, is a 84 y.o. female, DOB - [birth year], JWJ:[id number]  Admit date - 09/29/2017   Admitting Physician Haydee SalterPhillip M Hobbs, MD  Outpatient Primary MD for the patient is [name], Scott, MD  Outpatient specialists:   LOS - 1  days    Chief Complaint  Patient presents with  . Chest Pain       Brief summary  Alyssa Graves is a 84 y.o. female history significant for myocardial infarction x2, hypertension and hypothyroidism presents to the emergency room with a chief complaint of shortness of breath and chest pain.    Initial troponin negative.  Chest x-ray showed some mild interstitial edema. Patient with O2 saturation in low 90s on room air with labored breathing.    Possible pericardial effusion seen on bedside ultrasound by EDP and is admitted for further treatment.     Assessment & Plan    Active Problems:   Chest pain  #1 acute pulmonary edema/chest pain: - Marginally elevated serial cardiac enzymes in the setting of mild acute kidney injury -Continue telemetry monitoring -Continue diuresis, monitor electrolytes and renal function -2D echocardiogram pending -Supportive care  #2 hypertension: BP control #3 GERD: Continue PPI  #4 hyperlipidemia: Continue statin       Code Status: Full code DVT Prophylaxis: Heparin Family Communication: Discussed in detail with the patient, all imaging results, lab results explained to the patient and daughter   Disposition Plan: To be determined  Time Spent in minutes   35 minutes  Procedures:    Consultants:     Antimicrobials:      Medications  Scheduled Meds: . albuterol  2.5 mg Nebulization Once  . amLODipine  10 mg Oral QHS  . aspirin EC  325 mg Oral Daily  . furosemide  20 mg Intravenous Q12H  . heparin  5,000 Units Subcutaneous Q8H  . hydrALAZINE  25 mg Oral  Q8H  . hydrochlorothiazide  12.5 mg Oral Daily  . levothyroxine  50 mcg Oral QAC breakfast  . nitroGLYCERIN  1 inch Topical Q6H  . pantoprazole  40 mg Oral Daily  . potassium chloride SA  20 mEq Oral Daily  . ramipril  10 mg Oral Daily  . rosuvastatin  20 mg Oral Daily   Continuous Infusions: PRN Meds:.acetaminophen, albuterol, hydrALAZINE, morphine injection, nitroGLYCERIN, ondansetron (ZOFRAN) IV   Antibiotics   Anti-infectives (From admission, onward)   None        Subjective:   Alyssa Graves was seen and examined today. Patient denies dizziness, chest pain, shortness of breath, abdominal pain  Objective:   Vitals:   09/30/17 0620 09/30/17 0746 09/30/17 0944 09/30/17 1117  BP: 122/84 (!) 142/57 (!) 134/56 125/65  Pulse: 63 63  78  Resp: 18 20  16   Temp: 98.2 F (36.8 C) 98.1 F (36.7 C)  99.1 F (37.3 C)  TempSrc: Oral Oral  Oral  SpO2: 90% 93%  94%  Weight:      Height:  Intake/Output Summary (Last 24 hours) at 09/30/2017 1157 Last data filed at 09/30/2017 0920 Gross per 24 hour  Intake 390 ml  Output 1650 ml  Net -1260 ml     Wt Readings from Last 3 Encounters:  09/30/17 90.4 kg (199 lb 3.2 oz)  12/28/16 93.3 kg (205 lb 9.6 oz)  06/29/16 94 kg (207 lb 3.2 oz)     Exam  General: NAD  HEENT: NCAT,  PERRL,MMM  Neck: SUPPLE, (-) JVD  Cardiovascular: RRR, (-) GALLOP, (-) MURMUR  Respiratory: CTA  Gastrointestinal: SOFT, (-) DISTENSION, BS(+), (_) TENDERNESS  Ext: (-) CYANOSIS, (-) EDEMA  Neuro: A, OX 3  Skin:(-) RASH  Psych:NORMAL AFFECT/MOOD   Data Reviewed:  I have personally reviewed following labs and imaging studies  Micro Results No results found for this or any previous visit (from the past 240 hour(s)).  Radiology Reports Dg Chest 2 View  Result Date: 09/29/2017 CLINICAL DATA:  Generalized weakness with chest pain. EXAM: CHEST - 2 VIEW COMPARISON:  04/18/2015 FINDINGS: Stable cardiomegaly with aortic  atherosclerosis. Mild interstitial edema with left basilar atelectasis. No effusion or pneumothorax. No pulmonary consolidation. Osseous elements are stable with degenerative change about both shoulders and dorsal spine. IMPRESSION: Stable cardiomegaly with aortic atherosclerosis and mild interstitial edema. Left basilar atelectasis. Electronically Signed   By: Tollie Eth M.D.   On: 09/29/2017 21:11   Dg Chest Port 1 View  Result Date: 09/30/2017 CLINICAL DATA:  Pulmonary edema. History of hypertension and MI x2. Former smoker. EXAM: PORTABLE CHEST 1 VIEW COMPARISON:  Chest x-ray dated 09/29/2017 FINDINGS: Stable cardiomegaly. Mild central pulmonary vascular congestion which is likely chronic. Lungs otherwise clear. No confluent opacity to suggest a developing pneumonia. No pleural effusion or pneumothorax seen. No acute or suspicious osseous finding. IMPRESSION: No active disease. No evidence of pneumonia or pulmonary edema. Stable cardiomegaly. Electronically Signed   By: Bary Richard M.D.   On: 09/30/2017 09:14    Lab Data:  CBC: Recent Labs  Lab 09/29/17 2034 09/29/17 2211  WBC 4.6 4.6  HGB 9.8* 9.8*  HCT 30.5* 30.4*  MCV 88.7 89.7  PLT 252 251   Basic Metabolic Panel: Recent Labs  Lab 09/29/17 2034 09/29/17 2211 09/30/17 0623  NA 135  --  137  K 4.4  --  3.6  CL 104  --  100*  CO2 23  --  24  GLUCOSE 134*  --  94  BUN 24*  --  20  CREATININE 1.18* 1.20* 1.15*  CALCIUM 9.1  --  9.2   GFR: Estimated Creatinine Clearance: 37.5 mL/min (A) (by C-G formula based on SCr of 1.15 mg/dL (H)). Liver Function Tests: No results for input(s): AST, ALT, ALKPHOS, BILITOT, PROT, ALBUMIN in the last 168 hours. No results for input(s): LIPASE, AMYLASE in the last 168 hours. No results for input(s): AMMONIA in the last 168 hours. Coagulation Profile: No results for input(s): INR, PROTIME in the last 168 hours. Cardiac Enzymes: Recent Labs  Lab 09/30/17 0036 09/30/17 0623  TROPONINI  0.03* 0.03*   BNP (last 3 results) No results for input(s): PROBNP in the last 8760 hours. HbA1C: No results for input(s): HGBA1C in the last 72 hours. CBG: No results for input(s): GLUCAP in the last 168 hours. Lipid Profile: No results for input(s): CHOL, HDL, LDLCALC, TRIG, CHOLHDL, LDLDIRECT in the last 72 hours. Thyroid Function Tests: No results for input(s): TSH, T4TOTAL, FREET4, T3FREE, THYROIDAB in the last 72 hours. Anemia Panel: No results for  input(s): VITAMINB12, FOLATE, FERRITIN, TIBC, IRON, RETICCTPCT in the last 72 hours. Urine analysis:    Component Value Date/Time   COLORURINE YELLOW 04/22/2015 0032   APPEARANCEUR CLEAR 04/22/2015 0032   LABSPEC 1.011 04/22/2015 0032   PHURINE 5.5 04/22/2015 0032   GLUCOSEU NEGATIVE 04/22/2015 0032   GLUCOSEU Neg mg/dL 16/03/9603 5409   HGBUR NEGATIVE 04/22/2015 0032   BILIRUBINUR NEGATIVE 04/22/2015 0032   KETONESUR NEGATIVE 04/22/2015 0032   PROTEINUR NEGATIVE 04/22/2015 0032   UROBILINOGEN 1.0 04/22/2015 0032   NITRITE NEGATIVE 04/22/2015 0032   LEUKOCYTESUR SMALL (A) 04/22/2015 0032     Jackie Plum M.D. [facility] 09/30/2017, 11:57 AM  Pager: [phone] Between 7am to 7pm - call Pager - [phone]  After 7pm go to [email or web address] - password [email or web address]  Call night coverage person covering after 7pm

## 2017-10-01 DIAGNOSIS — I110 Hypertensive heart disease with heart failure: Secondary | ICD-10-CM | POA: Diagnosis not present

## 2017-10-01 DIAGNOSIS — I208 Other forms of angina pectoris: Secondary | ICD-10-CM

## 2017-10-01 DIAGNOSIS — I313 Pericardial effusion (noninflammatory): Secondary | ICD-10-CM | POA: Diagnosis not present

## 2017-10-01 DIAGNOSIS — R0789 Other chest pain: Secondary | ICD-10-CM | POA: Diagnosis not present

## 2017-10-01 LAB — BASIC METABOLIC PANEL
Anion gap: 15 (ref 5–15)
BUN: 29 mg/dL — ABNORMAL HIGH (ref 6–20)
CALCIUM: 9 mg/dL (ref 8.9–10.3)
CO2: 22 mmol/L (ref 22–32)
CREATININE: 1.46 mg/dL — AB (ref 0.44–1.00)
Chloride: 99 mmol/L — ABNORMAL LOW (ref 101–111)
GFR, EST AFRICAN AMERICAN: 36 mL/min — AB (ref >60)
GFR, EST NON AFRICAN AMERICAN: 31 mL/min — AB (ref >60)
Glucose, Bld: 98 mg/dL (ref 65–99)
Potassium: 3.7 mmol/L (ref 3.5–5.1)
Sodium: 136 mmol/L (ref 135–145)

## 2017-10-01 LAB — TROPONIN I
TROPONIN I: 0.03 ng/mL — AB (ref <0.03)
Troponin I: 0.04 ng/mL (ref <0.03)

## 2017-10-01 MED ORDER — AMLODIPINE BESYLATE 5 MG PO TABS: 5.0000 mg | ORAL_TABLET | Freq: Every day | ORAL | 0 refills | Status: DC

## 2017-10-01 MED ORDER — FUROSEMIDE 10 MG/ML IJ SOLN
20.0000 mg | Freq: Every day | INTRAMUSCULAR | Status: DC
  Administered 2017-10-01: 20 mg via INTRAVENOUS
  Filled 2017-10-01: qty 2

## 2017-10-01 MED ORDER — FUROSEMIDE 20 MG PO TABS: 20.0000 mg | ORAL_TABLET | Freq: Every day | ORAL | 0 refills | Status: DC

## 2017-10-01 MED ORDER — POTASSIUM CHLORIDE CRYS ER 10 MEQ PO TBCR: 10.0000 meq | EXTENDED_RELEASE_TABLET | Freq: Every day | ORAL | 0 refills | Status: DC

## 2017-10-01 NOTE — Consult Note (Signed)
Cardiology Consult    Patient ID: Alyssa Graves MRN: [id number], DOB/AGE: March 23, 1930   Admit date: 09/29/2017 Date of Consult: 10/01/2017  Primary Physician: [name], MD Primary Cardiologist: Dr. Swaziland  Requesting Provider: Dr. Allena Katz  Reason for Consultation: Chest pain  Alyssa Graves is a 84 y.o. female who is being seen today for the evaluation of chest pain at the request of Dr. Allena Katz.   Patient Profile    84 yo female with PMH of HTN, HL, nonobstructive CAD who presented with chest pain and pericardial effusion.   Past Medical History   Past Medical History:  Diagnosis Date  . Arthritis   . GERD (gastroesophageal reflux disease)   . Glucose intolerance (impaired glucose tolerance)   . Heart attack (HCC)    x2  . HTN (hypertension)   . Hypercholesterolemia   . Hypothyroid     Past Surgical History:  Procedure Laterality Date  . CHOLECYSTECTOMY    . FOOT SURGERY    . hammer toes    . UMBILICAL HERNIA REPAIR       Allergies  No Known Allergies  History of Present Illness    Alyssa Graves is a 84 yo female with PMH of HTN, HL, nonobstructive CAD. She is followed by Dr. Swaziland as an outpatient. Cath back in 2010 showed mild nonobstructive CAD with normal EF. Had a myoview in 2011 was normal. She was last seen in the office on 7/18 with her daughter and reported being at her baseline.   She currently lives at home alone but someone comes to stay with her in the evening. Daughter gave most of the history. Reports 09/29/17 she walked next door to her family's house and told them she was not feeling well. Stated she had some pain in the right arm and right side near her breast. Family became concerned and called EMS. On their arrival she complained of chest pain. Given 324mg  ASA and 1 SL nitro with improvement in symptoms. Daughter also reports they have noticed increase swelling to her legs, and that she occasionally gets short of breath with minimal  activity. Though she is still very active for her age.   In the ED her labs showed stable electrolytes, Hgb 9.8, Trop 0.03>>0.04>>0.03, BNP . CXR with mild edema. Bedside echo done by EDP showed concern for pericardial effusion. She was admitted and started on IV lasix. Follow up echo showed normal EF with G1DD, PA pressure of 28 mmHg, and large pericardial effusion with no tamponade.  Inpatient Medications    . albuterol  2.5 mg Nebulization Once  . amLODipine  10 mg Oral QHS  . aspirin EC  325 mg Oral Daily  . furosemide  20 mg Intravenous Daily  . heparin  5,000 Units Subcutaneous Q8H  . levothyroxine  50 mcg Oral QAC breakfast  . pantoprazole  40 mg Oral Daily  . potassium chloride SA  20 mEq Oral Daily  . rosuvastatin  20 mg Oral Daily    Family History    Family History  Problem Relation Age of Onset  . Heart attack Neg Hx     Social History    Social History   Socioeconomic History  . Marital status: Married    Spouse name: Not on file  . Number of children: Not on file  . Years of education: Not on file  . Highest education level: Not on file  Occupational History  . Not on file  Social Needs  .  Financial resource strain: Not on file  . Food insecurity:    Worry: Not on file    Inability: Not on file  . Transportation needs:    Medical: Not on file    Non-medical: Not on file  Tobacco Use  . Smoking status: Former Games developer  . Smokeless tobacco: Never Used  Substance and Sexual Activity  . Alcohol use: No    Alcohol/week: 0.0 oz  . Drug use: No  . Sexual activity: Not Currently  Lifestyle  . Physical activity:    Days per week: Not on file    Minutes per session: Not on file  . Stress: Not on file  Relationships  . Social connections:    Talks on phone: Not on file    Gets together: Not on file    Attends religious service: Not on file    Active member of club or organization: Not on file    Attends meetings of clubs or organizations: Not on file     Relationship status: Not on file  . Intimate partner violence:    Fear of current or ex partner: Not on file    Emotionally abused: Not on file    Physically abused: Not on file    Forced sexual activity: Not on file  Other Topics Concern  . Not on file  Social History Narrative  . Not on file     Review of Systems    See HPI  All other systems reviewed and are otherwise negative except as noted above.  Physical Exam    Blood pressure 128/64, pulse 72, temperature 98.7 F (37.1 C), temperature source Oral, resp. rate 18, height 5\' 3"  (1.6 m), weight 197 lb 4.8 oz (89.5 kg), SpO2 93 %.  General: Pleasant, older AAF, NAD Psych: Normal affect. Neuro: Alert and oriented X 3. Moves all extremities spontaneously. HEENT: Normal, HOH  Neck: Supple, no JVD. Lungs:  Resp regular and unlabored, CTA. Heart: RRR no s3, s4, no murmurs. Abdomen: Soft, non-tender, non-distended, BS + x 4.  Extremities: No clubbing, cyanosis, 1+ edema LE bilaterally. DP/PT/Radials 2+ and equal bilaterally.  Labs    Troponin [facility] of Care Test) Recent Labs    09/29/17 2049  TROPIPOC 0.00   Recent Labs    09/30/17 1242 09/30/17 1904 09/30/17 2344 10/01/17 0536  TROPONINI 0.04* 0.03* 0.04* 0.03*   Lab Results  Component Value Date   WBC 4.6 09/29/2017   HGB 9.8 (L) 09/29/2017   HCT 30.4 (L) 09/29/2017   MCV 89.7 09/29/2017   PLT 251 09/29/2017    Recent Labs  Lab 10/01/17 0536  NA 136  K 3.7  CL 99*  CO2 22  BUN 29*  CREATININE 1.46*  CALCIUM 9.0  GLUCOSE 98   Lab Results  Component Value Date   CHOL 136 11/09/2015   HDL 53 11/09/2015   LDLCALC 44 11/09/2015   TRIG 194 (H) 11/09/2015   No results found for: [facility]   Radiology Studies    Dg Chest 2 View  Result Date: 09/29/2017 CLINICAL DATA:  Generalized weakness with chest pain. EXAM: CHEST - 2 VIEW COMPARISON:  04/18/2015 FINDINGS: Stable cardiomegaly with aortic atherosclerosis. Mild interstitial edema with left  basilar atelectasis. No effusion or pneumothorax. No pulmonary consolidation. Osseous elements are stable with degenerative change about both shoulders and dorsal spine. IMPRESSION: Stable cardiomegaly with aortic atherosclerosis and mild interstitial edema. Left basilar atelectasis. Electronically Signed   By: Tollie Eth M.D.   On: 09/29/2017  21:11   Dg Chest Port 1 View  Result Date: 09/30/2017 CLINICAL DATA:  Pulmonary edema. History of hypertension and MI x2. Former smoker. EXAM: PORTABLE CHEST 1 VIEW COMPARISON:  Chest x-ray dated 09/29/2017 FINDINGS: Stable cardiomegaly. Mild central pulmonary vascular congestion which is likely chronic. Lungs otherwise clear. No confluent opacity to suggest a developing pneumonia. No pleural effusion or pneumothorax seen. No acute or suspicious osseous finding. IMPRESSION: No active disease. No evidence of pneumonia or pulmonary edema. Stable cardiomegaly. Electronically Signed   By: Bary RichardStan  Maynard M.D.   On: 09/30/2017 09:14    ECG & Cardiac Imaging    EKG:  The EKG was personally reviewed and demonstrates SR with PVCs  Echo: 09/30/17  Study Conclusions  - Left ventricle: The cavity size was normal. There was moderate   concentric hypertrophy. Systolic function was normal. The   estimated ejection fraction was in the range of 60% to 65%.   Hypokinesis of the mid-apicalanteroseptal and inferoseptal   myocardium. Doppler parameters are consistent with abnormal left   ventricular relaxation (grade 1 diastolic dysfunction). Doppler   parameters are consistent with indeterminate ventricular filling   pressure. - Aortic valve: Transvalvular velocity was within the normal range.   There was no stenosis. There was no regurgitation. - Mitral valve: Transvalvular velocity was within the normal range.   There was no evidence for stenosis. There was mild regurgitation. - Left atrium: The atrium was mildly dilated. - Right ventricle: The cavity size was  normal. Wall thickness was   normal. Systolic function was normal. - Tricuspid valve: There was mild regurgitation. - Pulmonary arteries: Systolic pressure was within the normal   range. PA peak pressure: 28 mm Hg (S). - Pericardium, extracardiac: A large pericardial effusion was   identified along the left ventricular free wall measuring 2.6 cm.   Features were not consistent with tamponade physiology.   Assessment & Plan    84 yo female with PMH of HTN, HL, nonobstructive CAD who presented with chest pain and pericardial effusion.   1. Chest Pain: Family reports she told the paramedics she had chest pain once they arrived. She is a difficult historian, and denies any symptoms when asked. No further symptoms since admission. Trop with flat non ACS trend. Normal EF with hypokinesis in the mid-apicalanteroseptal wall. Suspect her symptoms may have been due to her pericardial effusion.   2. Large Pericardial Effusion: noted on echo. Normal EF, with no signs of tamponade on echo. She appear asymptomatic at the time of exam. Given her advanced age would prefer to treat medically. Has been on IV lasix, though I&O do not reflect significant UOP. Likely needs a daily diuretic. Unclear why she now has this pericardial effusion as she has not had any viral illnesses, and no hx of CA. May be warranted to obtain CT chest to rule out malignancy. Would transition her to oral lasix in the am. If she remains stable, would plan to follow as an outpt with repeat echo.   3. Hypothyroidism: on synthroid   Signed, Laverda PageLindsay Maire Govan, NP-C Pager [phone] 10/01/2017, 12:54 PM

## 2017-10-01 NOTE — Care Management Note (Signed)
Case Management Note  Patient Details  Name: Alyssa Graves MRN: [id number] Date of Birth: Apr 20, 1930  Subjective/Objective:     Chest Pain              Action/Plan: Patient lives at home with daughter; Outpatient Primary MD is [name], Scott, MD; has [facility] with prescription drug coverage; pharmacy of choice is [facility]; [facility], [facility]; Corrie DandyMary with [facility] called for arrangements;   Expected Discharge Date:  10/01/17               Expected Discharge Plan:  Home w Home Health Services   Discharge planning Services  CM Consult  Choice offered to:  Patient, Adult Children  [facility]  Status of Service:  In process, will continue to follow  Reola MosherChandler, Marshal Schrecengost L, RN,MHA,BSN [phone] 10/01/2017, 3:20 PM

## 2017-10-01 NOTE — Progress Notes (Addendum)
Received message from Maralyn SagoSarah UR patient needs a code [id number]; patient has been discharged home/ left the building per Attending RN. Abelino DerrickB Darrio Bade [facility],MHA,BSN [phone]

## 2017-10-02 ENCOUNTER — Encounter (HOSPITAL_COMMUNITY): Payer: Self-pay

## 2017-10-02 ENCOUNTER — Observation Stay (HOSPITAL_COMMUNITY)
Admission: EM | Admit: 2017-10-02 | Discharge: 2017-10-03 | Disposition: A | Discharge status: Home / Self Care | Payer: Medicare Other | Attending: Internal Medicine | Admitting: Internal Medicine

## 2017-10-02 ENCOUNTER — Emergency Department (HOSPITAL_COMMUNITY): Payer: Medicare Other

## 2017-10-02 DIAGNOSIS — M19012 Primary osteoarthritis, left shoulder: Secondary | ICD-10-CM | POA: Insufficient documentation

## 2017-10-02 DIAGNOSIS — Z9049 Acquired absence of other specified parts of digestive tract: Secondary | ICD-10-CM | POA: Insufficient documentation

## 2017-10-02 DIAGNOSIS — I700 Atherosclerosis of aorta: Secondary | ICD-10-CM | POA: Insufficient documentation

## 2017-10-02 DIAGNOSIS — R410 Disorientation, unspecified: Secondary | ICD-10-CM | POA: Insufficient documentation

## 2017-10-02 DIAGNOSIS — Z87891 Personal history of nicotine dependence: Secondary | ICD-10-CM | POA: Diagnosis not present

## 2017-10-02 DIAGNOSIS — I252 Old myocardial infarction: Secondary | ICD-10-CM | POA: Diagnosis not present

## 2017-10-02 DIAGNOSIS — K5730 Diverticulosis of large intestine without perforation or abscess without bleeding: Secondary | ICD-10-CM | POA: Insufficient documentation

## 2017-10-02 DIAGNOSIS — H905 Unspecified sensorineural hearing loss: Secondary | ICD-10-CM | POA: Diagnosis not present

## 2017-10-02 DIAGNOSIS — R55 Syncope and collapse: Secondary | ICD-10-CM | POA: Diagnosis not present

## 2017-10-02 DIAGNOSIS — F0390 Unspecified dementia without behavioral disturbance: Secondary | ICD-10-CM | POA: Diagnosis not present

## 2017-10-02 DIAGNOSIS — Z7982 Long term (current) use of aspirin: Secondary | ICD-10-CM | POA: Insufficient documentation

## 2017-10-02 DIAGNOSIS — E860 Dehydration: Secondary | ICD-10-CM | POA: Insufficient documentation

## 2017-10-02 DIAGNOSIS — K219 Gastro-esophageal reflux disease without esophagitis: Secondary | ICD-10-CM | POA: Insufficient documentation

## 2017-10-02 DIAGNOSIS — M19011 Primary osteoarthritis, right shoulder: Secondary | ICD-10-CM | POA: Insufficient documentation

## 2017-10-02 DIAGNOSIS — M1990 Unspecified osteoarthritis, unspecified site: Secondary | ICD-10-CM | POA: Diagnosis not present

## 2017-10-02 DIAGNOSIS — E7800 Pure hypercholesterolemia, unspecified: Secondary | ICD-10-CM | POA: Insufficient documentation

## 2017-10-02 DIAGNOSIS — K449 Diaphragmatic hernia without obstruction or gangrene: Secondary | ICD-10-CM | POA: Diagnosis not present

## 2017-10-02 DIAGNOSIS — I313 Pericardial effusion (noninflammatory): Secondary | ICD-10-CM | POA: Insufficient documentation

## 2017-10-02 DIAGNOSIS — N644 Mastodynia: Secondary | ICD-10-CM | POA: Insufficient documentation

## 2017-10-02 DIAGNOSIS — D649 Anemia, unspecified: Secondary | ICD-10-CM | POA: Diagnosis not present

## 2017-10-02 DIAGNOSIS — E039 Hypothyroidism, unspecified: Secondary | ICD-10-CM | POA: Insufficient documentation

## 2017-10-02 DIAGNOSIS — R5383 Other fatigue: Secondary | ICD-10-CM | POA: Insufficient documentation

## 2017-10-02 DIAGNOSIS — I447 Left bundle-branch block, unspecified: Secondary | ICD-10-CM | POA: Insufficient documentation

## 2017-10-02 DIAGNOSIS — R2681 Unsteadiness on feet: Secondary | ICD-10-CM | POA: Insufficient documentation

## 2017-10-02 DIAGNOSIS — Z79899 Other long term (current) drug therapy: Secondary | ICD-10-CM | POA: Insufficient documentation

## 2017-10-02 DIAGNOSIS — I3139 Other pericardial effusion (noninflammatory): Secondary | ICD-10-CM | POA: Diagnosis present

## 2017-10-02 DIAGNOSIS — E7439 Other disorders of intestinal carbohydrate absorption: Secondary | ICD-10-CM | POA: Insufficient documentation

## 2017-10-02 DIAGNOSIS — I4891 Unspecified atrial fibrillation: Secondary | ICD-10-CM | POA: Insufficient documentation

## 2017-10-02 DIAGNOSIS — N281 Cyst of kidney, acquired: Secondary | ICD-10-CM | POA: Insufficient documentation

## 2017-10-02 DIAGNOSIS — G2589 Other specified extrapyramidal and movement disorders: Secondary | ICD-10-CM | POA: Insufficient documentation

## 2017-10-02 DIAGNOSIS — I2510 Atherosclerotic heart disease of native coronary artery without angina pectoris: Secondary | ICD-10-CM | POA: Diagnosis not present

## 2017-10-02 DIAGNOSIS — M6281 Muscle weakness (generalized): Secondary | ICD-10-CM | POA: Insufficient documentation

## 2017-10-02 DIAGNOSIS — I10 Essential (primary) hypertension: Secondary | ICD-10-CM | POA: Diagnosis present

## 2017-10-02 LAB — CBC WITH DIFFERENTIAL/PLATELET
BASOS ABS: 0 10*3/uL (ref 0.0–0.1)
BASOS PCT: 0 %
Eosinophils Absolute: 0.1 10*3/uL (ref 0.0–0.7)
Eosinophils Relative: 2 %
HEMATOCRIT: 31.1 % — AB (ref 36.0–46.0)
Hemoglobin: 10.2 g/dL — ABNORMAL LOW (ref 12.0–15.0)
LYMPHS PCT: 18 %
Lymphs Abs: 1.1 10*3/uL (ref 0.7–4.0)
MCH: 29.1 pg (ref 26.0–34.0)
MCHC: 32.8 g/dL (ref 30.0–36.0)
MCV: 88.6 fL (ref 78.0–100.0)
Monocytes Absolute: 0.7 10*3/uL (ref 0.1–1.0)
Monocytes Relative: 12 %
NEUTROS PCT: 68 %
Neutro Abs: 4 10*3/uL (ref 1.7–7.7)
Platelets: 234 10*3/uL (ref 150–400)
RBC: 3.51 MIL/uL — AB (ref 3.87–5.11)
RDW: 14.4 % (ref 11.5–15.5)
WBC: 5.8 10*3/uL (ref 4.0–10.5)

## 2017-10-02 LAB — COMPREHENSIVE METABOLIC PANEL
ALT: 18 U/L (ref 14–54)
AST: 29 U/L (ref 15–41)
Albumin: 4.1 g/dL (ref 3.5–5.0)
Alkaline Phosphatase: 51 U/L (ref 38–126)
Anion gap: 13 (ref 5–15)
BUN: 29 mg/dL — AB (ref 6–20)
CHLORIDE: 101 mmol/L (ref 101–111)
CO2: 23 mmol/L (ref 22–32)
Calcium: 9 mg/dL (ref 8.9–10.3)
Creatinine, Ser: 1.56 mg/dL — ABNORMAL HIGH (ref 0.44–1.00)
GFR, EST AFRICAN AMERICAN: 34 mL/min — AB (ref >60)
GFR, EST NON AFRICAN AMERICAN: 29 mL/min — AB (ref >60)
Glucose, Bld: 105 mg/dL — ABNORMAL HIGH (ref 65–99)
Potassium: 4.3 mmol/L (ref 3.5–5.1)
Sodium: 137 mmol/L (ref 135–145)
TOTAL PROTEIN: 7 g/dL (ref 6.5–8.1)
Total Bilirubin: 0.8 mg/dL (ref 0.3–1.2)

## 2017-10-02 LAB — BRAIN NATRIURETIC PEPTIDE: B Natriuretic Peptide: 70.5 pg/mL (ref 0.0–100.0)

## 2017-10-02 LAB — CBG MONITORING, ED: GLUCOSE-CAPILLARY: 102 mg/dL — AB (ref 65–99)

## 2017-10-02 LAB — TROPONIN I: TROPONIN I: 0.03 ng/mL — AB (ref <0.03)

## 2017-10-02 MED ORDER — VITAMIN D 1000 UNITS PO TABS
2000.0000 [IU] | ORAL_TABLET | Freq: Every day | ORAL | Status: DC
  Administered 2017-10-03: 2000 [IU] via ORAL
  Filled 2017-10-02: qty 2

## 2017-10-02 MED ORDER — IOPAMIDOL (ISOVUE-300) INJECTION 61%
INTRAVENOUS | Status: AC
  Filled 2017-10-02: qty 100

## 2017-10-02 MED ORDER — ROSUVASTATIN CALCIUM 20 MG PO TABS
20.0000 mg | ORAL_TABLET | Freq: Every day | ORAL | Status: DC
  Administered 2017-10-03: 20 mg via ORAL
  Filled 2017-10-02 (×2): qty 1

## 2017-10-02 MED ORDER — AMLODIPINE BESYLATE 5 MG PO TABS: 5.0000 mg | ORAL_TABLET | Freq: Every day | ORAL | Status: DC

## 2017-10-02 MED ORDER — PANTOPRAZOLE SODIUM 40 MG PO TBEC
40.0000 mg | DELAYED_RELEASE_TABLET | Freq: Every day | ORAL | Status: DC
  Administered 2017-10-03: 40 mg via ORAL
  Filled 2017-10-02: qty 1

## 2017-10-02 MED ORDER — ASPIRIN EC 81 MG PO TBEC
81.0000 mg | DELAYED_RELEASE_TABLET | Freq: Every day | ORAL | Status: DC
  Administered 2017-10-03: 81 mg via ORAL
  Filled 2017-10-02: qty 1

## 2017-10-02 MED ORDER — LEVOTHYROXINE SODIUM 50 MCG PO TABS
50.0000 ug | ORAL_TABLET | Freq: Every day | ORAL | Status: DC
  Administered 2017-10-03: 50 ug via ORAL
  Filled 2017-10-02 (×2): qty 1

## 2017-10-02 MED ORDER — SODIUM CHLORIDE 0.9 % IV SOLN
INTRAVENOUS | Status: DC
  Administered 2017-10-02: 15:00:00 via INTRAVENOUS

## 2017-10-02 MED ORDER — CALCIUM CARBONATE 1250 (500 CA) MG PO TABS
1250.0000 mg | ORAL_TABLET | Freq: Two times a day (BID) | ORAL | Status: DC
Start: 2017-10-03 — End: 2017-10-03
  Administered 2017-10-03: 1250 mg via ORAL
  Filled 2017-10-02 (×3): qty 1

## 2017-10-02 MED ORDER — ENOXAPARIN SODIUM 30 MG/0.3ML ~~LOC~~ SOLN
30.0000 mg | Freq: Every day | SUBCUTANEOUS | Status: DC
  Administered 2017-10-03: 30 mg via SUBCUTANEOUS
  Filled 2017-10-02 (×2): qty 0.3

## 2017-10-02 MED ORDER — IOPAMIDOL (ISOVUE-300) INJECTION 61%
100.0000 mL | Freq: Once | INTRAVENOUS | Status: AC | PRN
  Administered 2017-10-02: 100 mL via INTRAVENOUS

## 2017-10-02 MED ORDER — SODIUM CHLORIDE 0.9% FLUSH
3.0000 mL | Freq: Two times a day (BID) | INTRAVENOUS | Status: DC
  Administered 2017-10-03 (×2): 3 mL via INTRAVENOUS

## 2017-10-02 NOTE — ED Notes (Signed)
Admitting doctor at bedside 

## 2017-10-02 NOTE — ED Notes (Signed)
BNP in process with main lab at this time

## 2017-10-02 NOTE — H&P (Signed)
History and Physical    Alyssa Graves:[id number] DOB: 11/01/1929 DOA: 10/02/2017  PCP: [name], MD  Patient coming from: Home  I have personally briefly reviewed patient's old medical records in [facility] Link  Chief Complaint: Near syncope  HPI: Alyssa Graves is a 84 y.o. female with medical history significant of MI x2, HTN, hypothyroidism.  Patient was recently admitted 4/13-4/15 for SOB, CP.  CXR showed mild interstitial edema which improved with diuresis.  2D echo showed large pericardial effusion.  Patient was discharged on lasix 20mg  PO daily, they didn't want to tap the effusion given patients age but Dr. Allyson Sabal was concerned in his note of possible malignancy (see consult note).  After discharge yesterday patient was transiently okay, but today, had an episode of near syncope, near fall, and subsequently had multiple episodes of waxing/waning interactivity, as well as persistent abdominal pain.  When the patient states that her whole abdomen hurts.   ED Course: CT ABD and pelvis just showed extensive atherosclerosis but nothing really acute.  Creat is 1.5 up from 1.2.   Review of Systems: As per HPI otherwise 10 point review of systems negative.   Past Medical History:  Diagnosis Date  . Arthritis   . GERD (gastroesophageal reflux disease)   . Glucose intolerance (impaired glucose tolerance)   . Heart attack (HCC)    x2  . HTN (hypertension)   . Hypercholesterolemia   . Hypothyroid     Past Surgical History:  Procedure Laterality Date  . CHOLECYSTECTOMY    . FOOT SURGERY    . hammer toes    . UMBILICAL HERNIA REPAIR       reports that she has quit smoking. She has never used smokeless tobacco. She reports that she does not drink alcohol or use drugs.  No Known Allergies  Family History  Problem Relation Age of Onset  . Heart attack Neg Hx      Prior to Admission medications   Medication Sig Start Date End Date Taking? Authorizing  Provider  amLODipine (NORVASC) 5 MG tablet Take 1 tablet (5 mg total) by mouth at bedtime. 10/01/17  Yes Rolly Salter, MD  aspirin EC 81 MG tablet Take 81 mg by mouth daily.   Yes [provider]  Calcium Carbonate (CALCIUM 600 PO) Take 600 mg by mouth 2 (two) times daily.    Yes [provider]  Cholecalciferol (VITAMIN D) 2000 UNITS tablet Take 2,000 Units by mouth daily at 12 noon.   Yes [provider]  furosemide (LASIX) 20 MG tablet Take 1 tablet (20 mg total) by mouth daily. Take extra 20 mg for weight gain of 3 lbs in 1 day or 5 lbs in 2 days 10/01/17 10/01/18 Yes Rolly Salter, MD  levothyroxine (SYNTHROID, LEVOTHROID) 50 MCG tablet Take 50 mcg by mouth daily before breakfast.   Yes [provider]  omeprazole (PRILOSEC) 20 MG capsule TAKE 1 CAPSULE BY MOUTH ONCE DAILY 04/26/17  Yes Sw[place]il[place], Peter M, MD  potassium chloride SA (K-DUR,KLOR-CON) 10 MEQ tablet Take 1 tablet (10 mEq total) by mouth daily. 10/02/17  Yes Rolly Salter, MD  rosuvastatin (CRESTOR) 20 MG tablet TAKE 1 TABLET BY MOUTH DAILY. 09/18/17  Yes [place], Peter M, MD  nitroGLYCERIN (NITROSTAT) 0.4 MG SL tablet Place 1 tablet (0.4 mg total) under the tongue every 5 (five) minutes as needed for chest pain. 09/15/13   Rosalio Macadamia, NP    Physical Exam: Vitals:  10/02/17 1844 10/02/17 1930 10/02/17 2030 10/02/17 2130  BP: 135/61 (!) 148/60 (!) 131/50 (!) 120/91  Pulse: (!) 52 69 64 70  Resp: 17 19 20  (!) 22  Temp:      TempSrc:      SpO2: 98% 92% 93% 94%    Constitutional: NAD, calm, comfortable Eyes: PERRL, lids and conjunctivae normal ENMT: Mucous membranes are moist. Posterior pharynx clear of any exudate or lesions.Normal dentition.  Neck: normal, supple, no masses, no thyromegaly Respiratory: clear to auscultation bilaterally, no wheezing, no crackles. Normal respiratory effort. No accessory muscle use.  Cardiovascular: Regular rate and rhythm, no murmurs / rubs / gallops. No  extremity edema. 2+ pedal pulses. No carotid bruits.  Abdomen: no tenderness, no masses palpated. No hepatosplenomegaly. Bowel sounds positive.  Musculoskeletal: no clubbing / cyanosis. No joint deformity upper and lower extremities. Good ROM, no contractures. Normal muscle tone.  Skin: no rashes, lesions, ulcers. No induration Neurologic: CN 2-12 grossly intact. Sensation intact, DTR normal. Strength 5/5 in all 4.  Psychiatric: Normal judgment and insight. Alert and oriented x 3. Normal mood.    Labs on Admission: I have personally reviewed following labs and imaging studies  CBC: Recent Labs  Lab 09/29/17 2034 09/29/17 2211 10/02/17 1621  WBC 4.6 4.6 5.8  NEUTROABS  --   --  4.0  HGB 9.8* 9.8* 10.2*  HCT 30.5* 30.4* 31.1*  MCV 88.7 89.7 88.6  PLT 252 251 234   Basic Metabolic Panel: Recent Labs  Lab 09/29/17 2034 09/29/17 2211 09/30/17 0623 10/01/17 0536 10/02/17 1621  NA 135  --  137 136 137  K 4.4  --  3.6 3.7 4.3  CL 104  --  100* 99* 101  CO2 23  --  24 22 23   GLUCOSE 134*  --  94 98 105*  BUN 24*  --  20 29* 29*  CREATININE 1.18* 1.20* 1.15* 1.46* 1.56*  CALCIUM 9.1  --  9.2 9.0 9.0   GFR: Estimated Creatinine Clearance: 27.5 mL/min (A) (by C-G formula based on SCr of 1.56 mg/dL (H)). Liver Function Tests: Recent Labs  Lab 10/02/17 1621  AST 29  ALT 18  ALKPHOS 51  BILITOT 0.8  PROT 7.0  ALBUMIN 4.1   No results for input(s): LIPASE, AMYLASE in the last 168 hours. No results for input(s): AMMONIA in the last 168 hours. Coagulation Profile: No results for input(s): INR, PROTIME in the last 168 hours. Cardiac Enzymes: Recent Labs  Lab 09/30/17 1242 09/30/17 1904 09/30/17 2344 10/01/17 0536 10/02/17 1621  TROPONINI 0.04* 0.03* 0.04* 0.03* 0.03*   BNP (last 3 results) No results for input(s): PROBNP in the last 8760 hours. HbA1C: No results for input(s): HGBA1C in the last 72 hours. CBG: Recent Labs  Lab 10/02/17 1222  GLUCAP 102*    Lipid Profile: No results for input(s): CHOL, HDL, LDLCALC, TRIG, CHOLHDL, LDLDIRECT in the last 72 hours. Thyroid Function Tests: No results for input(s): TSH, T4TOTAL, FREET4, T3FREE, THYROIDAB in the last 72 hours. Anemia Panel: No results for input(s): VITAMINB12, FOLATE, FERRITIN, TIBC, IRON, RETICCTPCT in the last 72 hours. Urine analysis:    Component Value Date/Time   COLORURINE YELLOW 04/22/2015 0032   APPEARANCEUR CLEAR 04/22/2015 0032   LABSPEC 1.011 04/22/2015 0032   PHURINE 5.5 04/22/2015 0032   GLUCOSEU NEGATIVE 04/22/2015 0032   GLUCOSEU Neg mg/dL 16/10/960406/13/2008 [id number]   HGBUR NEGATIVE 04/22/2015 0032   BILIRUBINUR NEGATIVE 04/22/2015 0032   KETONESUR NEGATIVE 04/22/2015 0032  PROTEINUR NEGATIVE 04/22/2015 0032   UROBILINOGEN 1.0 04/22/2015 0032   NITRITE NEGATIVE 04/22/2015 0032   LEUKOCYTESUR SMALL (A) 04/22/2015 0032    Radiological Exams on Admission: Dg Chest 2 View  Result Date: 10/02/2017 CLINICAL DATA:  Lethargy and near syncopal episode EXAM: CHEST - 2 VIEW COMPARISON:  09/30/2017 FINDINGS: Cardiomegaly is again seen. Aortic calcifications are noted and stable. No focal infiltrate or sizable effusion is seen. No acute bony abnormality is noted. IMPRESSION: No active cardiopulmonary disease. Electronically Signed   By: Alcide Clever M.D.   On: 10/02/2017 13:20   Ct Abdomen Pelvis W Contrast  Result Date: 10/02/2017 CLINICAL DATA:  Abdominal pain and lethargy EXAM: CT ABDOMEN AND PELVIS WITH CONTRAST TECHNIQUE: Multidetector CT imaging of the abdomen and pelvis was performed using the standard protocol following bolus administration of intravenous contrast. CONTRAST:  ISOVUE-300 IOPAMIDOL (ISOVUE-300) INJECTION 61% COMPARISON:  April 18, 2015 FINDINGS: Lower chest: There is scarring in the left base. No lung base edema or consolidation. There is a stable pericardial effusion. There is a small hiatal hernia. Hepatobiliary: No focal liver lesions are evident.  Gallbladder is absent. There is no appreciable biliary duct dilatation. Pancreas: No pancreatic mass or inflammatory focus. Spleen: No splenic lesions are evident. Adrenals/Urinary Tract: Adrenals bilaterally appear unremarkable. There is scarring in the upper pole left kidney. There is a 1.2 x 0.8 cm cyst in the lateral mid right kidney. There is a 7 mm cyst arising from the anterior lower pole left kidney. No hydronephrosis is evident on either side. There is no renal or ureteral calculus on either side. Urinary bladder is midline with wall thickness within normal limits. Stomach/Bowel: There is extensive sigmoid diverticulosis without diverticulitis evident. More scattered diverticula elsewhere in the colon without diverticulitis. There is no appreciable bowel wall or mesenteric thickening. No evident bowel obstruction. No free air or portal venous air. No bowel pneumatosis. Vascular/Lymphatic: There is extensive aortoiliac atherosclerotic calcification. There are multiple areas of hemodynamically significant obstruction in the common and external iliac arteries bilaterally. There is also extensive internal iliac artery calcification. Major mesenteric arterial vessels are patent, although there is atherosclerotic calcification in these vessels. No adenopathy is appreciable in the abdomen or pelvis. Reproductive: Uterus is retroverted.  No evident pelvic mass. Other: Appendix appears normal. There is no ascites or abscess in the abdomen or pelvis. Slight soft tissue stranding is noted in the anterior wall of the upper pelvis without well-defined fluid or abscess. Musculoskeletal: There is degenerative change in the lower thoracic and lumbar regions. No evident blastic or lytic bone lesions. No intramuscular lesions are evident. IMPRESSION: 1. Colonic diverticula, most notably in the sigmoid region. No diverticulitis evident. No bowel pneumatosis or bowel obstruction. No abscess. Appendix appears normal. 2. No  evident renal or ureteral calculus. No hydronephrosis. Scarring is noted in the upper pole left kidney posteriorly. 3. Extensive aortic and pelvic arterial atherosclerosis with areas of hemodynamically significant obstruction at multiple sites in the pelvic arterial vessels. Extensive mesenteric arterial calcification without mesenteric arterial occlusion. 4.  Stable pericardial effusion. 5.  Small hiatal hernia. 6. Soft tissue stranding in the anterior wall of the upper pelvis on the right. No fluid collection or abscess in this area. Question prior trauma in this area with residual stranding. Electronically Signed   By: Bretta Bang III M.D.   On: 10/02/2017 17:05    EKG: Independently reviewed.  Assessment/Plan Principal Problem:   Near syncope Active Problems:   HTN (hypertension)  Pericardial effusion    1. Near syncope - 1. Possibly due to over dehydration from lasix given creat bump. 2. Will rehydrate patient by holding lasix 3. Tele monitor 2. Pericardial effusion - 1. Put message in to P.Trent for cards re-eval in AM since it looks like patient didn't tolerate the planned lasix. 3. HTN - 1. Holding amlodipine for tonight, plan to resume tomorrow.  DVT prophylaxis: Lovenox Code Status: Full Family Communication: Family at bedside Disposition Plan: Home after admit Consults called: None called, put message in to P.Trent for routine cards eval in AM Admission status: Place in [place], [place]. DO [facility] Pager [phone]  If 7AM-7PM, please contact day team taking care of patient [email or web address] Password [email or web address]  10/02/2017, 10:45 PM

## 2017-10-02 NOTE — ED Notes (Signed)
Pt ambulatory to the restroom with steady gait, pt now eating at bedside, ok by EDP

## 2017-10-02 NOTE — Discharge Summary (Signed)
[facility] Discharge Summary   Patient: Alyssa Graves WUJ:[id number]   PCP: [name], MD DOB: 1929-07-05   Date of admission: 09/29/2017   Date of discharge: 10/01/2017     Discharge Diagnoses:  Active Problems:   Chest pain   Admitted From: home Disposition:  Home with home health, per patient request  Recommendations for Outpatient Follow-up:  1. Please follow-up with PCP in 1 week, cardiology as recommended.  Follow-up Information    [name], MD. Schedule an appointment as soon as possible for a visit in 1 week.   Specialty:  Internal Medicine Why:  Dr Alphonsus Sias office will be calling to schedule follow up appointment Contact information: [address] [place] [address] [phone]        [place], Peter M, MD. Go on 10/23/2017.   Specialty:  Cardiology Why:  @ 8:30 for follow up with Metta Clines PA. Contact information: [address] [place] [address] [phone]        Home, [facility] Follow up.   Specialty:  Home Health Services Why:  They will do your home health care at your home Contact information: [address] [place] [address] [phone]          Diet recommendation: Cardiac diet  Activity: The patient is advised to gradually reintroduce usual activities.  Discharge Condition: good  Code Status: Full code  History of present illness: As per the H and P dictated on admission, "Alyssa Graves is a 84 y.o. female history significant for myocardial infarction x2, hypertension and hypothyroidism presents to the emergency room with a chief complaint of shortness of breath and chest pain.  This problem started acutely today.  Patient had dyspnea on exertion and began clutching her chest.  Family came alarmed.  Urged patient to the emergency room.  She was initially resistant.  After some time patient was able to be convinced.  Brought to the emergency room for further  evaluation.  ED course: Initial troponin negative.  Chest x-ray showed some mild interstitial edema.  Patient with O2 saturation in low 90s on room air.  Labored breathing.  Hospitalist consulted for admission.  Patient tried to leave the emergency room.  Requested bedside ultrasound from EDP.  Possible pericardial effusion seen on bedside ultrasound.  Patient agreed to stay."  Hospital Course:  Summary of her active problems in the hospital is as following.  Acute on chronic diastolic dysfunction. Large pericardial effusion. No tamponade on echocardiogram. Noncardiac chest pain. Presented with dyspnea on exertion as well as chest pain. Troponins are unremarkable. Echocardiogram office performed which shows normal EF with hypokinesis and mid anteroseptal wall. Cardiology was consulted. Echocardiogram also showed presence of large pericardial effusion without any tamponade etiology.  Patient's heart rate remained stable and she was not hypotensive. Patient was given IV Lasix with improvement in her oxygenation as well as symptoms. She had cardiology input, felt that patient is back to baseline for her CHF and pericardial effusion will need gentle diuresis and outpatient repeat echocardiogram. With presence of large pericardial effusion potential malignancy also need to be ruled out which can be done as an outpatient with PCPs office. At present we will discharge this patient home as per her request and her family's request with a 20 mg Lasix and extra 20 mg as needed.  Hypertension Blood pressure is relatively well controlled here in the hospital will hold ramipril and hydrochlorothiazide and hydralazine going home. Continue Norvasc and Lasix.  Hypothyroidism Cont OP synthroid 50 mcg qd No signs of hyper or hypothyroidism  GERD  Cont PPI  Hyperlipidemia Continue statin  All other chronic medical condition were stable during the hospitalization.  Patient was ambulatory without  any assistance. Home health with RN was arranged on discharge. On the day of the discharge the patient's vitals were stable , and no other acute medical condition were reported by patient. the patient was felt safe to be discharge at home with home health.  Procedures and Results:  Echocardiogram    Consultations:  Cardiology  DISCHARGE MEDICATION: Allergies as of 10/01/2017   No Known Allergies     Medication List    STOP taking these medications   hydrALAZINE 25 MG tablet Commonly known as:  APRESOLINE   hydrochlorothiazide 12.5 MG capsule Commonly known as:  MICROZIDE   ramipril 10 MG capsule Commonly known as:  ALTACE     TAKE these medications   amLODipine 5 MG tablet Commonly known as:  NORVASC Take 1 tablet (5 mg total) by mouth at bedtime. What changed:    medication strength  how much to take   aspirin EC 81 MG tablet Take 81 mg by mouth daily.   CALCIUM 600 PO Take 600 mg by mouth 2 (two) times daily.   furosemide 20 MG tablet Commonly known as:  LASIX Take 1 tablet (20 mg total) by mouth daily. Take extra 20 mg for weight gain of 3 lbs in 1 day or 5 lbs in 2 days   levothyroxine 50 MCG tablet Commonly known as:  SYNTHROID, LEVOTHROID Take 50 mcg by mouth daily before breakfast.   nitroGLYCERIN 0.4 MG SL tablet Commonly known as:  NITROSTAT Place 1 tablet (0.4 mg total) under the tongue every 5 (five) minutes as needed for chest pain.   omeprazole 20 MG capsule Commonly known as:  PRILOSEC TAKE 1 CAPSULE BY MOUTH ONCE DAILY   potassium chloride 10 MEQ tablet Commonly known as:  K-DUR,KLOR-CON Take 1 tablet (10 mEq total) by mouth daily. What changed:    medication strength  See the new instructions.   rosuvastatin 20 MG tablet Commonly known as:  CRESTOR TAKE 1 TABLET BY MOUTH DAILY.   Vitamin D 2000 units tablet Take 2,000 Units by mouth daily at 12 noon.      No Known Allergies Discharge Instructions    Diet - low sodium  heart healthy   Complete by:  As directed    Discharge instructions   Complete by:  As directed    It is important that you read following instructions as well as go over your medication list with RN to help you understand your care after this hospitalization.  Discharge Instructions: Please follow-up with PCP in one week  Please request your primary care physician to go over all Hospital Tests and Procedure/Radiological results at the follow up,  Please get all Hospital records sent to your PCP by signing hospital release before you go home.   Do not take more than prescribed Pain, Sleep and Anxiety Medications. You were cared for by a hospitalist during your hospital stay. If you have any questions about your discharge medications or the care you received while you were in the hospital after you are discharged, you can call the unit and ask to speak with the hospitalist on call if the hospitalist that took care of you is not available.  Once you are discharged, your primary care physician will handle any further medical issues. Please  note that NO REFILLS for any discharge medications will be authorized once you are discharged, as it is imperative that you return to your primary care physician (or establish a relationship with a primary care physician if you do not have one) for your aftercare needs so that they can reassess your need for medications and monitor your lab values. You Must read complete instructions/literature along with all the possible adverse reactions/side effects for all the Medicines you take and that have been prescribed to you. Take any new Medicines after you have completely understood and accept all the possible adverse reactions/side effects. Wear Seat belts while driving. If you have smoked or chewed Tobacco in the last 2 yrs please stop smoking and/or stop any Recreational drug use.   Increase activity slowly   Complete by:  As directed      Discharge Exam: Filed  Weights   09/29/17 2305 09/30/17 0314 10/01/17 0416  Weight: 92.9 kg (204 lb 11.2 oz) 90.4 kg (199 lb 3.2 oz) 89.5 kg (197 lb 4.8 oz)   Vitals:   10/01/17 0416 10/01/17 1000  BP: (!) 123/55 128/64  Pulse: 66 72  Resp: 18   Temp: 98.2 F (36.8 C) 98.7 F (37.1 C)  SpO2: 95% 93%   General: Appear in no distress, no Rash; Oral Mucosa moist. Cardiovascular: S1 and S2 Present, no Murmur, no JVD Respiratory: Bilateral Air entry present and Clear to Auscultation, no Crackles, no wheezes Abdomen: Bowel Sound present, Soft and no tenderness Extremities: no Pedal edema, no calf tenderness Neurology: Grossly no focal neuro deficit.  The results of significant diagnostics from this hospitalization (including imaging, microbiology, ancillary and laboratory) are listed below for reference.    Significant Diagnostic Studies: Dg Chest 2 View  Result Date: 10/02/2017 CLINICAL DATA:  Lethargy and near syncopal episode EXAM: CHEST - 2 VIEW COMPARISON:  09/30/2017 FINDINGS: Cardiomegaly is again seen. Aortic calcifications are noted and stable. No focal infiltrate or sizable effusion is seen. No acute bony abnormality is noted. IMPRESSION: No active cardiopulmonary disease. Electronically Signed   By: Alcide Clever M.D.   On: 10/02/2017 13:20   Dg Chest 2 View  Result Date: 09/29/2017 CLINICAL DATA:  Generalized weakness with chest pain. EXAM: CHEST - 2 VIEW COMPARISON:  04/18/2015 FINDINGS: Stable cardiomegaly with aortic atherosclerosis. Mild interstitial edema with left basilar atelectasis. No effusion or pneumothorax. No pulmonary consolidation. Osseous elements are stable with degenerative change about both shoulders and dorsal spine. IMPRESSION: Stable cardiomegaly with aortic atherosclerosis and mild interstitial edema. Left basilar atelectasis. Electronically Signed   By: Tollie Eth M.D.   On: 09/29/2017 21:11   Dg Chest Port 1 View  Result Date: 09/30/2017 CLINICAL DATA:  Pulmonary edema.  History of hypertension and MI x2. Former smoker. EXAM: PORTABLE CHEST 1 VIEW COMPARISON:  Chest x-ray dated 09/29/2017 FINDINGS: Stable cardiomegaly. Mild central pulmonary vascular congestion which is likely chronic. Lungs otherwise clear. No confluent opacity to suggest a developing pneumonia. No pleural effusion or pneumothorax seen. No acute or suspicious osseous finding. IMPRESSION: No active disease. No evidence of pneumonia or pulmonary edema. Stable cardiomegaly. Electronically Signed   By: Bary Richard M.D.   On: 09/30/2017 09:14    Microbiology: No results found for this or any previous visit (from the past 240 hour(s)).   Labs: CBC: Recent Labs  Lab 09/29/17 2034 09/29/17 2211  WBC 4.6 4.6  HGB 9.8* 9.8*  HCT 30.5* 30.4*  MCV 88.7 89.7  PLT 252 251  Basic Metabolic Panel: Recent Labs  Lab 09/29/17 2034 09/29/17 2211 09/30/17 0623 10/01/17 0536  NA 135  --  137 136  K 4.4  --  3.6 3.7  CL 104  --  100* 99*  CO2 23  --  24 22  GLUCOSE 134*  --  94 98  BUN 24*  --  20 29*  CREATININE 1.18* 1.20* 1.15* 1.46*  CALCIUM 9.1  --  9.2 9.0   Liver Function Tests: No results for input(s): AST, ALT, ALKPHOS, BILITOT, PROT, ALBUMIN in the last 168 hours. No results for input(s): LIPASE, AMYLASE in the last 168 hours. No results for input(s): AMMONIA in the last 168 hours. Cardiac Enzymes: Recent Labs  Lab 09/30/17 0623 09/30/17 1242 09/30/17 1904 09/30/17 2344 10/01/17 0536  TROPONINI 0.03* 0.04* 0.03* 0.04* 0.03*   BNP (last 3 results) Recent Labs    09/29/17 2034  BNP 109.5*   CBG: Recent Labs  Lab 10/02/17 1222  GLUCAP 102*   Time spent: 35 minutes  Signed:  Lynden OxfordPranav Kyal Arts  [facility] 10/01/2017 , 2:43 PM

## 2017-10-02 NOTE — ED Triage Notes (Signed)
PT arrives to ED via EMS from home with complaints of lethargy with near syncopal episode with family and confusion. PT was seen here yesterday and d/c home. PT a&ox 4 with EMS and appears back at baseline. Family at bedside.   cbg 135 154/66 Hr 60 spo2 94% RA rr 18

## 2017-10-02 NOTE — ED Provider Notes (Signed)
[facility] EMERGENCY DEPARTMENT Provider Note   CSN: [id number] Arrival date & time: 10/02/17  1215     History   Chief Complaint Chief Complaint  Patient presents with  . Weakness  . Near Syncope    HPI Alyssa Graves is a 84 y.o. female.  HPI She went presents 1 day after discharge, now with concern of near syncope, generalized weakness, and abdominal pain. Patient is here with her granddaughter who assists with the history. The patient herself has extremely poor hearing, reads lips. History seems accurate, with the assistance of the granddaughter. It seems as though since discharge yesterday patient was transiently okay, but today, had an episode of near syncope, near fall, and subsequently had multiple episodes of waxing/waning interactivity, as well as persistent abdominal pain.  When the patient states that her whole abdomen hurts. Granddaughter notes that the patient has not had bowel movements and an unknown amount of time. Patient denies any chest pain, dyspnea. She states that she felt as though she was going to die just before coming to the emergency department. No clear precipitating, alleviating, exacerbating factors. Past Medical History:  Diagnosis Date  . Arthritis   . GERD (gastroesophageal reflux disease)   . Glucose intolerance (impaired glucose tolerance)   . Heart attack (HCC)    x2  . HTN (hypertension)   . Hypercholesterolemia   . Hypothyroid     Patient Active Problem List   Diagnosis Date Noted  . Edema of leg 05/11/2015  . Chest pain 04/18/2015  . Abdominal pain, acute 04/18/2015  . HTN (hypertension) 04/18/2015  . Glucose intolerance (impaired glucose tolerance) 04/18/2015  . Abdominal pain, epigastric 04/18/2015  . ANEMIA, NORMOCYTIC, CHRONIC 02/10/2009  . BREAST PAIN, RIGHT 10/14/2008  . DIAPHORESIS 04/17/2007  . INFECTION, LOCAL SKIN/SUBCUTANEOUS TISSUE NOS 02/19/2007  . Dyslipidemia 01/14/2007  . LOSS,  SENSORINEURAL HEARING, BILAT 01/14/2007  . Essential hypertension 01/14/2007  . Coronary atherosclerosis 01/14/2007  . VAGINITIS, ATROPHIC 01/14/2007  . OSTEITIS CONDENSANS 01/14/2007  . ABDOMINAL PAIN, LEFT LOWER QUADRANT 11/16/2006    Past Surgical History:  Procedure Laterality Date  . CHOLECYSTECTOMY    . FOOT SURGERY    . hammer toes    . UMBILICAL HERNIA REPAIR       OB History   None      Home Medications    Prior to Admission medications   Medication Sig Start Date End Date Taking? Authorizing Provider  amLODipine (NORVASC) 5 MG tablet Take 1 tablet (5 mg total) by mouth at bedtime. 10/01/17   Rolly Salter, MD  aspirin EC 81 MG tablet Take 81 mg by mouth daily.    [provider]  Calcium Carbonate (CALCIUM 600 PO) Take 600 mg by mouth 2 (two) times daily.     [provider]  Cholecalciferol (VITAMIN D) 2000 UNITS tablet Take 2,000 Units by mouth daily at 12 noon.    [provider]  furosemide (LASIX) 20 MG tablet Take 1 tablet (20 mg total) by mouth daily. Take extra 20 mg for weight gain of 3 lbs in 1 day or 5 lbs in 2 days 10/01/17 10/01/18  Rolly Salter, MD  levothyroxine (SYNTHROID, LEVOTHROID) 50 MCG tablet Take 50 mcg by mouth daily before breakfast.    [provider]  nitroGLYCERIN (NITROSTAT) 0.4 MG SL tablet Place 1 tablet (0.4 mg total) under the tongue every 5 (five) minutes as needed for chest pain. 09/15/13   Rosalio Macadamia, NP  omeprazole (PRILOSEC) 20 MG capsule TAKE 1 CAPSULE BY MOUTH ONCE DAILY 04/26/17   [place], Peter M, MD  potassium chloride SA (K-DUR,KLOR-CON) 10 MEQ tablet Take 1 tablet (10 mEq total) by mouth daily. 10/02/17   Rolly SalterPatel, Pranav M, MD  rosuvastatin (CRESTOR) 20 MG tablet TAKE 1 TABLET BY MOUTH DAILY. 09/18/17   [place], Peter M, MD    Family History Family History  Problem Relation Age of Onset  . Heart attack Neg Hx     Social History Social History   Tobacco Use  . Smoking status:  Former Games developermoker  . Smokeless tobacco: Never Used  Substance Use Topics  . Alcohol use: No    Alcohol/week: 0.0 oz  . Drug use: No     Allergies   Patient has no known allergies.   Review of Systems Review of Systems  Constitutional:       Per HPI, otherwise negative  HENT:       Per HPI, otherwise negative  Respiratory:       Per HPI, otherwise negative  Cardiovascular:       Per HPI, otherwise negative  Gastrointestinal: Positive for abdominal pain. Negative for vomiting.  Endocrine:       Negative aside from HPI  Genitourinary:       Neg aside from HPI   Musculoskeletal:       Per HPI, otherwise negative  Skin: Negative.   Neurological: Positive for weakness and light-headedness. Negative for syncope.     Physical Exam Updated Vital Signs BP (!) 140/56 (BP Location: Right Arm)   Pulse 60   Temp 98 F (36.7 C) (Oral)   Resp 18   SpO2 94%   Physical Exam  Constitutional: She is oriented to person, place, and time. No distress.  Sickly yet obese elderly female with poor hearing, but interacting appropriately when she understands the questions.  HENT:  Head: Normocephalic and atraumatic.  Eyes: Conjunctivae and EOM are normal.  Cardiovascular: Normal rate and regular rhythm.  Pulmonary/Chest: Effort normal and breath sounds normal. No stridor. No respiratory distress.  Abdominal: She exhibits no distension. There is generalized tenderness. There is guarding.  Musculoskeletal: She exhibits no edema.  Neurological: She is alert and oriented to person, place, and time. A cranial nerve deficit is present.  Very poor hearing. Patient moves all extremities spontaneously.  Skin: Skin is warm and dry.  Psychiatric: She is withdrawn.  Nursing note and vitals reviewed.    ED Treatments / Results  Labs (all labs ordered are listed, but only abnormal results are displayed) Labs Reviewed  COMPREHENSIVE METABOLIC PANEL - Abnormal; Notable for the following components:       Result Value   Glucose, Bld 105 (*)    BUN 29 (*)    Creatinine, Ser 1.56 (*)    GFR calc non Af Amer 29 (*)    GFR calc Af Amer 34 (*)    All other components within normal limits  TROPONIN I - Abnormal; Notable for the following components:   Troponin I 0.03 (*)    All other components within normal limits  CBC WITH DIFFERENTIAL/PLATELET - Abnormal; Notable for the following components:   RBC 3.51 (*)    Hemoglobin 10.2 (*)    HCT 31.1 (*)    All other components within normal limits  CBG MONITORING, ED - Abnormal; Notable for the following components:   Glucose-Capillary 102 (*)    All other components within normal limits  BRAIN NATRIURETIC PEPTIDE  EKG EKG Interpretation  Date/Time:  Tuesday October 02 2017 12:16:44 EDT Ventricular Rate:  61 PR Interval:    QRS Duration: 108 QT Interval:  432 QTC Calculation: 436 R Axis:   14 Text Interpretation:  Atrial fibrillation Incomplete left bundle branch block LVH with secondary repolarization abnormality Artifact in lead(s) I III aVL Confirmed by Gerhard Munch [phone]) on 10/02/2017 1:28:27 PM   Radiology Dg Chest 2 View  Result Date: 10/02/2017 CLINICAL DATA:  Lethargy and near syncopal episode EXAM: CHEST - 2 VIEW COMPARISON:  09/30/2017 FINDINGS: Cardiomegaly is again seen. Aortic calcifications are noted and stable. No focal infiltrate or sizable effusion is seen. No acute bony abnormality is noted. IMPRESSION: No active cardiopulmonary disease. Electronically Signed   By: Alcide Clever M.D.   On: 10/02/2017 13:20   Ct Abdomen Pelvis W Contrast  Result Date: 10/02/2017 CLINICAL DATA:  Abdominal pain and lethargy EXAM: CT ABDOMEN AND PELVIS WITH CONTRAST TECHNIQUE: Multidetector CT imaging of the abdomen and pelvis was performed using the standard protocol following bolus administration of intravenous contrast. CONTRAST:  ISOVUE-300 IOPAMIDOL (ISOVUE-300) INJECTION 61% COMPARISON:  April 18, 2015 FINDINGS: Lower  chest: There is scarring in the left base. No lung base edema or consolidation. There is a stable pericardial effusion. There is a small hiatal hernia. Hepatobiliary: No focal liver lesions are evident. Gallbladder is absent. There is no appreciable biliary duct dilatation. Pancreas: No pancreatic mass or inflammatory focus. Spleen: No splenic lesions are evident. Adrenals/Urinary Tract: Adrenals bilaterally appear unremarkable. There is scarring in the upper pole left kidney. There is a 1.2 x 0.8 cm cyst in the lateral mid right kidney. There is a 7 mm cyst arising from the anterior lower pole left kidney. No hydronephrosis is evident on either side. There is no renal or ureteral calculus on either side. Urinary bladder is midline with wall thickness within normal limits. Stomach/Bowel: There is extensive sigmoid diverticulosis without diverticulitis evident. More scattered diverticula elsewhere in the colon without diverticulitis. There is no appreciable bowel wall or mesenteric thickening. No evident bowel obstruction. No free air or portal venous air. No bowel pneumatosis. Vascular/Lymphatic: There is extensive aortoiliac atherosclerotic calcification. There are multiple areas of hemodynamically significant obstruction in the common and external iliac arteries bilaterally. There is also extensive internal iliac artery calcification. Major mesenteric arterial vessels are patent, although there is atherosclerotic calcification in these vessels. No adenopathy is appreciable in the abdomen or pelvis. Reproductive: Uterus is retroverted.  No evident pelvic mass. Other: Appendix appears normal. There is no ascites or abscess in the abdomen or pelvis. Slight soft tissue stranding is noted in the anterior wall of the upper pelvis without well-defined fluid or abscess. Musculoskeletal: There is degenerative change in the lower thoracic and lumbar regions. No evident blastic or lytic bone lesions. No intramuscular lesions  are evident. IMPRESSION: 1. Colonic diverticula, most notably in the sigmoid region. No diverticulitis evident. No bowel pneumatosis or bowel obstruction. No abscess. Appendix appears normal. 2. No evident renal or ureteral calculus. No hydronephrosis. Scarring is noted in the upper pole left kidney posteriorly. 3. Extensive aortic and pelvic arterial atherosclerosis with areas of hemodynamically significant obstruction at multiple sites in the pelvic arterial vessels. Extensive mesenteric arterial calcification without mesenteric arterial occlusion. 4.  Stable pericardial effusion. 5.  Small hiatal hernia. 6. Soft tissue stranding in the anterior wall of the upper pelvis on the right. No fluid collection or abscess in this area. Question prior trauma in this area with  residual stranding. Electronically Signed   By: Bretta Bang III M.D.   On: 10/02/2017 17:05    Procedures Procedures (including critical care time)  Medications Ordered in ED Medications  0.9 %  sodium chloride infusion ( Intravenous Paused 10/02/17 2134)  iopamidol (ISOVUE-300) 61 % injection (has no administration in time range)  iopamidol (ISOVUE-300) 61 % injection 100 mL (100 mLs Intravenous Contrast Given 10/02/17 1642)     Initial Impression / Assessment and Plan / ED Course  I have reviewed the triage vital signs and the nursing notes.  Pertinent labs & imaging results that were available during my care of the patient were reviewed by me and considered in my medical decision making (see chart for details).    Initial evaluation I reviewed the patient's chart including documentation from a recent stay with some suggestion of pericardial effusion, some consideration of possible malignant effusion. No recent CT abdomen pelvis.  With a recurrence of weakness, and with new abdominal pain, broad differential considered including ACS, stroke, intra-abdominal pathology including bowel obstruction, with no recent bowel  movements.  Stroke seems less likely given the patient's persistent movement of all extremities, denial of focal weakness and consistent speech with prior.  Repeat exam abdominal CT discussed, no notable findings consistent with malignancy, which was hypothesized during recent echocardiogram.   On repeat exam the patient is in similar condition, with concern for worsening renal function, ongoing confusion, and history of heart failure, some suspicion for diuresis contributing to her near syncope. Patient will require admission for further evaluation, management insertion of medication adjustments, though initial studies are reassuring.  Final Clinical Impressions(s) / ED Diagnoses  Near syncope  Gerhard Munch, MD 10/02/17 2221

## 2017-10-03 ENCOUNTER — Encounter (HOSPITAL_COMMUNITY): Payer: Self-pay | Admitting: *Deleted

## 2017-10-03 ENCOUNTER — Other Ambulatory Visit: Payer: Self-pay

## 2017-10-03 DIAGNOSIS — I313 Pericardial effusion (noninflammatory): Secondary | ICD-10-CM | POA: Diagnosis not present

## 2017-10-03 DIAGNOSIS — I10 Essential (primary) hypertension: Secondary | ICD-10-CM | POA: Diagnosis not present

## 2017-10-03 DIAGNOSIS — R410 Disorientation, unspecified: Secondary | ICD-10-CM

## 2017-10-03 DIAGNOSIS — R55 Syncope and collapse: Secondary | ICD-10-CM

## 2017-10-03 LAB — URINALYSIS, ROUTINE W REFLEX MICROSCOPIC
BILIRUBIN URINE: NEGATIVE
Bacteria, UA: NONE SEEN
GLUCOSE, UA: NEGATIVE mg/dL
HGB URINE DIPSTICK: NEGATIVE
KETONES UR: NEGATIVE mg/dL
LEUKOCYTES UA: NEGATIVE
NITRITE: NEGATIVE
PH: 5 (ref 5.0–8.0)
Protein, ur: 30 mg/dL — AB
SPECIFIC GRAVITY, URINE: 1.024 (ref 1.005–1.030)

## 2017-10-03 LAB — MRSA PCR SCREENING: MRSA BY PCR: NEGATIVE

## 2017-10-03 LAB — GLUCOSE, CAPILLARY: GLUCOSE-CAPILLARY: 96 mg/dL (ref 65–99)

## 2017-10-03 MED ORDER — FUROSEMIDE 20 MG PO TABS: 20.0000 mg | ORAL_TABLET | Freq: Every day | ORAL | 0 refills | Status: DC | PRN

## 2017-10-03 MED ORDER — POTASSIUM CHLORIDE CRYS ER 10 MEQ PO TBCR: 10.0000 meq | EXTENDED_RELEASE_TABLET | Freq: Every day | ORAL | 0 refills | Status: DC | PRN

## 2017-10-03 NOTE — Evaluation (Signed)
Physical Therapy Evaluation Patient Details Name: Alyssa Graves MRN: [id number] DOB: [birth year] Today's Date: 10/03/2017   History of Present Illness  Pt is an 84 y/o female admitted secondary to syncopal episode. PMH includes dementia, HTN, non obstructive CAD, pericardial effusion, and MI.   Clinical Impression  Pt admitted secondary to problem above with deficits below. Required min to min guard A for mobility without AD secondary to unsteadiness. Educated pt and family about use of RW at home to increase stability. Pt's oxygen sats ranging from 91%-98% on RA throughout mobility. Pt's daughter in law reports pt's daughter lives with pt and is able to provide assist. Will continue to follow acutely to maximize functional mobility independence and safety.     Follow Up Recommendations Home health PT;Supervision for mobility/OOB    Equipment Recommendations  Rolling walker with 5" wheels    Recommendations for Other Services OT consult     Precautions / Restrictions Precautions Precautions: Fall Restrictions Weight Bearing Restrictions: No      Mobility  Bed Mobility               General bed mobility comments: Sitting EOB upon entry.   Transfers Overall transfer level: Needs assistance Equipment used: None Transfers: Sit to/from Stand Sit to Stand: Min assist         General transfer comment: Min A for lift assist and steadying assist.   Ambulation/Gait Ambulation/Gait assistance: Min guard;Min assist Ambulation Distance (Feet): 150 Feet Assistive device: None Gait Pattern/deviations: Step-through pattern;Decreased stride length;Staggering right Gait velocity: Decreased  Gait velocity interpretation: <1.31 ft/sec, indicative of household ambulator General Gait Details: Slow, unsteady gait. LOB X2 requiring min A for steadying assist. Educated pt and family about use of RW at home to increase stability. Pt oxygen sats ranging from 91%-98% on RA throughout  ambulation.   Stairs            Wheelchair Mobility    Modified Rankin (Stroke Patients Only)       Balance Overall balance assessment: Needs assistance Sitting-balance support: No upper extremity supported;Feet supported Sitting balance-Leahy Scale: Good     Standing balance support: No upper extremity supported;During functional activity Standing balance-Leahy Scale: Fair Standing balance comment: Able to maintain static standing without UE support.                              Pertinent Vitals/Pain Pain Assessment: Faces Faces Pain Scale: No hurt    Home Living Family/patient expects to be discharged to:: Private residence Living Arrangements: Children Available Help at Discharge: Family;Available 24 hours/day Type of Home: House Home Access: Level entry     Home Layout: One level Home Equipment: None      Prior Function Level of Independence: Independent         Comments: Pt's daughter in law reports she dod not need any help with ambulation and with ADL tasks.      Hand Dominance        Extremity/Trunk Assessment   Upper Extremity Assessment Upper Extremity Assessment: Overall WFL for tasks assessed    Lower Extremity Assessment Lower Extremity Assessment: Generalized weakness    Cervical / Trunk Assessment Cervical / Trunk Assessment: Normal  Communication   Communication: HOH  Cognition Arousal/Alertness: Awake/alert Behavior During Therapy: WFL for tasks assessed/performed Overall Cognitive Status: History of cognitive impairments - at baseline  General Comments General comments (skin integrity, edema, etc.): Pt's daughter in law present during session. Educated about assist required at home and HHPT recommendations. Pt and pt's daughter in law agreeable. Notified RN and RN contacted case management.     Exercises     Assessment/Plan    PT Assessment Patient  needs continued PT services  PT Problem List Decreased strength;Decreased balance;Decreased mobility;Decreased knowledge of use of DME;Decreased knowledge of precautions;Decreased cognition       PT Treatment Interventions DME instruction;Gait training;Functional mobility training;Therapeutic activities;Therapeutic exercise;Balance training;Neuromuscular re-education;Patient/family education    PT Goals (Current goals can be found in the Care Plan section)  Acute Rehab PT Goals Patient Stated Goal: For pt to go home per daughter in law  PT Goal Formulation: With family Time For Goal Achievement: 10/17/17 Potential to Achieve Goals: Good    Frequency Min 3X/week   Barriers to discharge        Co-evaluation               AM-PAC PT "6 Clicks" Daily Activity  Outcome Measure Difficulty turning over in bed (including adjusting bedclothes, sheets and blankets)?: A Little Difficulty moving from lying on back to sitting on the side of the bed? : Unable Difficulty sitting down on and standing up from a chair with arms (e.g., wheelchair, bedside commode, etc,.)?: Unable Help needed moving to and from a bed to chair (including a wheelchair)?: A Little Help needed walking in hospital room?: A Little Help needed climbing 3-5 steps with a railing? : A Lot 6 Click Score: 13    End of Session Equipment Utilized During Treatment: Gait belt Activity Tolerance: Patient tolerated treatment well Patient left: in bed;with call bell/phone within reach;with family/visitor present Nurse Communication: Mobility status PT Visit Diagnosis: Unsteadiness on feet (R26.81);Muscle weakness (generalized) (M62.81)    Time: 1610-9604 PT Time Calculation (min) (ACUTE ONLY): 19 min   Charges:   PT Evaluation $PT Eval Low Complexity: 1 Low     PT G Codes:        Gladys Damme, PT, DPT  Acute Rehabilitation Services  Pager: [phone]   Lehman Prom 10/03/2017, 4:35 PM

## 2017-10-03 NOTE — Plan of Care (Signed)
Patient received discharge orders per md. Vital signs stable.

## 2017-10-03 NOTE — Progress Notes (Signed)
SATURATION QUALIFICATIONS: (This note is used to comply with regulatory documentation for home oxygen)  Patient Saturations on Room Air at Rest = 98%  Patient Saturations on Room Air while Ambulating = 91%   Please briefly explain why patient needs home oxygen: Pt sats ranging from 91-98% on RA during ambulation and did not require supplemental oxygen to maintain.   Gladys DammeBrittany Kayliana Codd, PT, DPT  Acute Rehabilitation Services  Pager: [phone]

## 2017-10-03 NOTE — ED Notes (Signed)
Pt is hard of hearing  

## 2017-10-03 NOTE — Discharge Summary (Addendum)
Physician Discharge Summary  Alyssa Graves ZOX:[id number] DOB: 31-Aug-1929 DOA: 10/02/2017  PCP: Alyssa Penna, MD  Admit date: 10/02/2017 Discharge date: 10/03/2017   Recommendations for Outpatient Follow-Up:   1. Lasix changed to PRN 2. Continued palliative care conversations 3. Resumed home health with addition of social work and home health RN   Discharge Diagnosis:   Principal Problem:   Near syncope Active Problems:   HTN (hypertension)   Pericardial effusion   Discharge disposition:  Home with family  Discharge Condition: Improved.  Diet recommendation: Low sodium, heart healthy  Wound care: None.   History of Present Illness:   Alyssa Graves is a 84 y.o. female with medical history significant of MI x2, HTN, hypothyroidism.  Patient was recently admitted 4/13-4/15 for SOB, CP.  CXR showed mild interstitial edema which improved with diuresis.  2D echo showed large pericardial effusion.  Patient was discharged on lasix 20mg  PO daily, they didn't want to tap the effusion given patients age but Dr. Allyson Graves was concerned in his note of possible malignancy (see consult note).  After discharge yesterday patient was transiently okay, but today, had an episode of near syncope, near fall, and subsequently had multiple episodes of waxing/waning interactivity, as well as persistent abdominal pain. When the patient states that her whole abdomen hurts.     Hospital Course by Problem:   Syncope:  Per cards: Was sent home on daily lasix dose for her dyspnea and pericardial effusion. In talking with the granddaughter the patient was not eating or drinking well. Snacks throughout the day mostly. Suspect she is now over diuresed. Given her age may be more beneficial to have care givers monitor to her and use PRN lasix at home. Seems she will not do well with a daily diuretic as she is not consistent with her intake habits.   Pericardial effusion: Noted on echo earlier  this week. No signs of tamponade on echo or exam. Given her age would not consider invasive intervention-- worrisome for malignancy per cards  Delirum on top of Dementia:  -palliative care referral  -home health -no sign of infection    Medical Consultants:   cards  Discharge Exam:   Vitals:   10/03/17 0925 10/03/17 1100  BP: (!) 159/61 (!) 148/47  Pulse: 61 (!) 54  Resp: 19 17  Temp:  98 F (36.7 C)  SpO2: 95% 96%   Vitals:   10/03/17 0515 10/03/17 0700 10/03/17 0925 10/03/17 1100  BP: (!) 148/41 (!) 165/53 (!) 159/61 (!) 148/47  Pulse: 69 61 61 (!) 54  Resp:  17 19 17   Temp: 98.5 F (36.9 C) 98.4 F (36.9 C)  98 F (36.7 C)  TempSrc: Oral Oral  Oral  SpO2: 95% 93% 95% 96%  Weight: 88.3 kg (194 lb 10.7 oz)     Height: 5\' 3"  (1.6 m)       Very hard of hearing, no wheezing on exam   The results of significant diagnostics from this hospitalization (including imaging, microbiology, ancillary and laboratory) are listed below for reference.     Procedures and Diagnostic Studies:   Dg Chest 2 View  Result Date: 10/02/2017 CLINICAL DATA:  Lethargy and near syncopal episode EXAM: CHEST - 2 VIEW COMPARISON:  09/30/2017 FINDINGS: Cardiomegaly is again seen. Aortic calcifications are noted and stable. No focal infiltrate or sizable effusion is seen. No acute bony abnormality is noted. IMPRESSION: No active cardiopulmonary disease. Electronically Signed   By: Eulah Pont.D.  On: 10/02/2017 13:20   Ct Abdomen Pelvis W Contrast  Result Date: 10/02/2017 CLINICAL DATA:  Abdominal pain and lethargy EXAM: CT ABDOMEN AND PELVIS WITH CONTRAST TECHNIQUE: Multidetector CT imaging of the abdomen and pelvis was performed using the standard protocol following bolus administration of intravenous contrast. CONTRAST:  ISOVUE-300 IOPAMIDOL (ISOVUE-300) INJECTION 61% COMPARISON:  April 18, 2015 FINDINGS: Lower chest: There is scarring in the left base. No lung base edema or  consolidation. There is a stable pericardial effusion. There is a small hiatal hernia. Hepatobiliary: No focal liver lesions are evident. Gallbladder is absent. There is no appreciable biliary duct dilatation. Pancreas: No pancreatic mass or inflammatory focus. Spleen: No splenic lesions are evident. Adrenals/Urinary Tract: Adrenals bilaterally appear unremarkable. There is scarring in the upper pole left kidney. There is a 1.2 x 0.8 cm cyst in the lateral mid right kidney. There is a 7 mm cyst arising from the anterior lower pole left kidney. No hydronephrosis is evident on either side. There is no renal or ureteral calculus on either side. Urinary bladder is midline with wall thickness within normal limits. Stomach/Bowel: There is extensive sigmoid diverticulosis without diverticulitis evident. More scattered diverticula elsewhere in the colon without diverticulitis. There is no appreciable bowel wall or mesenteric thickening. No evident bowel obstruction. No free air or portal venous air. No bowel pneumatosis. Vascular/Lymphatic: There is extensive aortoiliac atherosclerotic calcification. There are multiple areas of hemodynamically significant obstruction in the common and external iliac arteries bilaterally. There is also extensive internal iliac artery calcification. Major mesenteric arterial vessels are patent, although there is atherosclerotic calcification in these vessels. No adenopathy is appreciable in the abdomen or pelvis. Reproductive: Uterus is retroverted.  No evident pelvic mass. Other: Appendix appears normal. There is no ascites or abscess in the abdomen or pelvis. Slight soft tissue stranding is noted in the anterior wall of the upper pelvis without well-defined fluid or abscess. Musculoskeletal: There is degenerative change in the lower thoracic and lumbar regions. No evident blastic or lytic bone lesions. No intramuscular lesions are evident. IMPRESSION: 1. Colonic diverticula, most notably in  the sigmoid region. No diverticulitis evident. No bowel pneumatosis or bowel obstruction. No abscess. Appendix appears normal. 2. No evident renal or ureteral calculus. No hydronephrosis. Scarring is noted in the upper pole left kidney posteriorly. 3. Extensive aortic and pelvic arterial atherosclerosis with areas of hemodynamically significant obstruction at multiple sites in the pelvic arterial vessels. Extensive mesenteric arterial calcification without mesenteric arterial occlusion. 4.  Stable pericardial effusion. 5.  Small hiatal hernia. 6. Soft tissue stranding in the anterior wall of the upper pelvis on the right. No fluid collection or abscess in this area. Question prior trauma in this area with residual stranding. Electronically Signed   By: Bretta Bang III M.D.   On: 10/02/2017 17:05     Labs:   Basic Metabolic Panel: Recent Labs  Lab 09/29/17 2034 09/29/17 2211 09/30/17 0623 10/01/17 0536 10/02/17 1621  NA 135  --  137 136 137  K 4.4  --  3.6 3.7 4.3  CL 104  --  100* 99* 101  CO2 23  --  24 22 23   GLUCOSE 134*  --  94 98 105*  BUN 24*  --  20 29* 29*  CREATININE 1.18* 1.20* 1.15* 1.46* 1.56*  CALCIUM 9.1  --  9.2 9.0 9.0   GFR Estimated Creatinine Clearance: 27.3 mL/min (A) (by C-G formula based on SCr of 1.56 mg/dL (H)). Liver Function Tests: Recent  Labs  Lab 10/02/17 1621  AST 29  ALT 18  ALKPHOS 51  BILITOT 0.8  PROT 7.0  ALBUMIN 4.1   No results for input(s): LIPASE, AMYLASE in the last 168 hours. No results for input(s): AMMONIA in the last 168 hours. Coagulation profile No results for input(s): INR, PROTIME in the last 168 hours.  CBC: Recent Labs  Lab 09/29/17 2034 09/29/17 2211 10/02/17 1621  WBC 4.6 4.6 5.8  NEUTROABS  --   --  4.0  HGB 9.8* 9.8* 10.2*  HCT 30.5* 30.4* 31.1*  MCV 88.7 89.7 88.6  PLT 252 251 234   Cardiac Enzymes: Recent Labs  Lab 09/30/17 1242 09/30/17 1904 09/30/17 2344 10/01/17 0536 10/02/17 1621  TROPONINI  0.04* 0.03* 0.04* 0.03* 0.03*   BNP: Invalid input(s): POCBNP CBG: Recent Labs  Lab 10/02/17 1222 10/03/17 0812  GLUCAP 102* 96   D-Dimer No results for input(s): DDIMER in the last 72 hours. Hgb A1c No results for input(s): HGBA1C in the last 72 hours. Lipid Profile No results for input(s): CHOL, HDL, LDLCALC, TRIG, CHOLHDL, LDLDIRECT in the last 72 hours. Thyroid function studies No results for input(s): TSH, T4TOTAL, T3FREE, THYROIDAB in the last 72 hours.  Invalid input(s): FREET3 Anemia work up No results for input(s): VITAMINB12, FOLATE, FERRITIN, TIBC, IRON, RETICCTPCT in the last 72 hours. Microbiology Recent Results (from the past 240 hour(s))  MRSA PCR Screening     Status: None   Collection Time: 10/03/17  6:54 AM  Result Value Ref Range Status   MRSA by PCR NEGATIVE NEGATIVE Final    Comment:        The GeneXpert MRSA Assay (FDA approved for NASAL specimens only), is one component of a comprehensive MRSA colonization surveillance program. It is not intended to diagnose MRSA infection nor to guide or monitor treatment for MRSA infections. Performed at [facility], [address] [place], [place] [address]      Discharge Instructions:   Discharge Instructions    Diet - low sodium heart healthy   Complete by:  As directed    Discharge instructions   Complete by:  As directed    Resume home health -have placed palliative care referral, they will follow you at your house   Increase activity slowly   Complete by:  As directed      Allergies as of 10/03/2017   No Known Allergies     Medication List    TAKE these medications   amLODipine 5 MG tablet Commonly known as:  NORVASC Take 1 tablet (5 mg total) by mouth at bedtime.   aspirin EC 81 MG tablet Take 81 mg by mouth daily.   CALCIUM 600 PO Take 600 mg by mouth 2 (two) times daily.   furosemide 20 MG tablet Commonly known as:  LASIX Take 1 tablet (20 mg total) by mouth daily as  needed. for weight gain of 3 lbs in 1 day or 5 lbs in 2 days What changed:    when to take this  reasons to take this  additional instructions   levothyroxine 50 MCG tablet Commonly known as:  SYNTHROID, LEVOTHROID Take 50 mcg by mouth daily before breakfast.   nitroGLYCERIN 0.4 MG SL tablet Commonly known as:  NITROSTAT Place 1 tablet (0.4 mg total) under the tongue every 5 (five) minutes as needed for chest pain.   omeprazole 20 MG capsule Commonly known as:  PRILOSEC TAKE 1 CAPSULE BY MOUTH ONCE DAILY   potassium  chloride 10 MEQ tablet Commonly known as:  K-DUR,KLOR-CON Take 1 tablet (10 mEq total) by mouth daily as needed (when takes lasix). What changed:    when to take this  reasons to take this   rosuvastatin 20 MG tablet Commonly known as:  CRESTOR TAKE 1 TABLET BY MOUTH DAILY.   Vitamin D 2000 units tablet Take 2,000 Units by mouth daily at 12 noon.      Follow-up Information    Alyssa PennaHolwerda, Scott, MD Follow up in 1 week(s).   Specialty:  Internal Medicine Contact information: [address] [place] [id number] [phone]            Time coordinating discharge: 35 min  Signed:  Joseph ArtJessica U Jamario Colina   [facility] 10/03/2017, 3:57 PM

## 2017-10-03 NOTE — Care Management Note (Addendum)
Case Management Note  Patient Details  Name: Alyssa Graves MRN: [id number] Date of Birth: 01-Jun-1930  Subjective/Objective:     Pt admitted with syncope - pt recently discharge post stay with pericardial effusion               Action/Plan:  PTA from home with daughter.  [facility] - CM added HHSW and PT at daughters request - agency contacted and addition of service accepted.  CM made referral [facility] - spoke with Drenda FreezeFran - agency will follow up directly with pt/daughter Suzette BattiestVeronica.  Per pts daughter she will take pt home in private vehicle.  PTA this admit per daughter - pt did not need any mobility equipment in the home. CM informed by bedside nurse that pt does not require home oxygen.  CM offered choice for rolling walker as recommended by PT to daughter- [facility] - agency contacted and referral accepted    Expected Discharge Date:  10/03/17               Expected Discharge Plan:  Home w Home Health Services  In-House Referral:     Discharge planning Services  CM Consult  Post Acute Care Choice:    Choice offered to:  Adult Children  DME Arranged:  Walker rolling DME Agency:  [facility]  HH Arranged:  RN, SW, PT [facility]  Status of Service:  Completed, signed off  If discussed at [employer] Length of Stay Meetings, dates discussed:    Additional Comments:  Cherylann ParrClaxton, Gloristine Turrubiates S, RN 10/03/2017, 4:20 PM

## 2017-10-03 NOTE — Consult Note (Addendum)
Cardiology Consult    Patient ID: [name] MRN: [id number], DOB/AGE: 1930/02/18   Admit date: 10/02/2017 Date of Consult: 10/03/2017  Primary Physician: [name], MD Primary Cardiologist: Dr. [place] Requesting Provider: Dr. Julian Reil Reason for Consultation: Dehydration, large pericardial effusion   Alyssa Graves is a 84 y.o. female who is being seen today for the evaluation of dehydration, and pericardial effusion at the request of Dr. Julian Reil.  Patient Profile    84 yo female with PMH of HTN, HL, nonobstructive CAD and pericardial effusion who presented with syncope.   Past Medical History   Past Medical History:  Diagnosis Date  . Arthritis   . GERD (gastroesophageal reflux disease)   . Glucose intolerance (impaired glucose tolerance)   . Heart attack (HCC)    x2  . HTN (hypertension)   . Hypercholesterolemia   . Hypothyroid     Past Surgical History:  Procedure Laterality Date  . CHOLECYSTECTOMY    . FOOT SURGERY    . hammer toes    . UMBILICAL HERNIA REPAIR       Allergies  No Known Allergies  History of Present Illness    Alyssa Graves is an 84 yo female with PMH of HTN, HL, nonobstructive CAD and pericardial effusion. She had a cath back in 2010 that showed mild nonobstructive CAD and normal EF. Had a myoview in 2011 that was normal.   She currently lives at home with family staying with her on a regular basis. Was admitted on 09/29/17 with right arm/breast pain. Labs showed Trop 0.03>>0.04>>0.03. BNP 100, CXR with mild edema. Echo with normal EF, G1DD, and large pericardial effusion. She was diuresed with IV lasix. Pericardial effusion was felt to be stable with no signs of tamponade. Discharged home on 20mg  lasix daily. Family reports when she got home, she was not eating or drinking well. Yesterday morning was up attempting to cook breakfast when she felt hot and like she was going to pass out. Granddaughter says she caught herself on the  wall. EMS was called.   In the ED her labs showed stable electrolytes, Cr 1.5, Hgb 10.2, BNP 70. CT abd showed no acute findings with stable pericardial effusion. She was admitted to hospitalists service.   Inpatient Medications    . amLODipine  5 mg Oral QHS  . aspirin EC  81 mg Oral Daily  . calcium carbonate  1,250 mg Oral BID WC  . cholecalciferol  2,000 Units Oral Q1200  . enoxaparin (LOVENOX) injection  30 mg Subcutaneous Daily  . levothyroxine  50 mcg Oral QAC breakfast  . pantoprazole  40 mg Oral Daily  . rosuvastatin  20 mg Oral Daily  . sodium chloride flush  3 mL Intravenous Q12H    Family History    Family History  Problem Relation Age of Onset  . Heart attack Neg Hx     Social History    Social History   Socioeconomic History  . Marital status: Married    Spouse name: Not on file  . Number of children: Not on file  . Years of education: Not on file  . Highest education level: Not on file  Occupational History  . Not on file  Social Needs  . Financial resource strain: Not on file  . Food insecurity:    Worry: Not on file    Inability: Not on file  . Transportation needs:    Medical: Not on file    Non-medical: Not on  file  Tobacco Use  . Smoking status: Former Games developer  . Smokeless tobacco: Never Used  Substance and Sexual Activity  . Alcohol use: No    Alcohol/week: 0.0 oz  . Drug use: No  . Sexual activity: Not Currently  Lifestyle  . Physical activity:    Days per week: Not on file    Minutes per session: Not on file  . Stress: Not on file  Relationships  . Social connections:    Talks on phone: Not on file    Gets together: Not on file    Attends religious service: Not on file    Active member of club or organization: Not on file    Attends meetings of clubs or organizations: Not on file    Relationship status: Not on file  . Intimate partner violence:    Fear of current or ex partner: Not on file    Emotionally abused: Not on file     Physically abused: Not on file    Forced sexual activity: Not on file  Other Topics Concern  . Not on file  Social History Narrative  . Not on file     Review of Systems    General:  No chills, fever, night sweats or weight changes.  Cardiovascular:  No chest pain, dyspnea on exertion, edema, orthopnea, palpitations, paroxysmal nocturnal dyspnea. Dermatological: No rash, lesions/masses Respiratory: No cough, dyspnea Urologic: No hematuria, dysuria Abdominal:   No nausea, vomiting, diarrhea, bright red blood per rectum, melena, or hematemesis Neurologic:  No visual changes, wkns, changes in mental status. All other systems reviewed and are otherwise negative except as noted above.  Physical Exam    Blood pressure (!) 148/47, pulse (!) 54, temperature 98 F (36.7 C), temperature source Oral, resp. rate 17, height 5\' 3"  (1.6 m), weight 194 lb 10.7 oz (88.3 kg), SpO2 96 %.  General: Pleasant, NAD Psych: Normal affect. Neuro: Alert and oriented X 3. Moves all extremities spontaneously. HEENT: Normal  Neck: Supple without bruits or JVD. Lungs:  Resp regular and unlabored, CTA. Heart: RRR no s3, s4, or murmurs. Abdomen: Soft, non-tender, non-distended, BS + x 4.  Extremities: No clubbing, cyanosis or edema. DP/PT/Radials 2+ and equal bilaterally.  Labs    Troponin (Point of Care Test) No results for input(s): TROPIPOC in the last 72 hours. Recent Labs    09/30/17 1904 09/30/17 2344 10/01/17 0536 10/02/17 1621  TROPONINI 0.03* 0.04* 0.03* 0.03*   Lab Results  Component Value Date   WBC 5.8 10/02/2017   HGB 10.2 (L) 10/02/2017   HCT 31.1 (L) 10/02/2017   MCV 88.6 10/02/2017   PLT 234 10/02/2017    Recent Labs  Lab 10/02/17 1621  NA 137  K 4.3  CL 101  CO2 23  BUN 29*  CREATININE 1.56*  CALCIUM 9.0  PROT 7.0  BILITOT 0.8  ALKPHOS 51  ALT 18  AST 29  GLUCOSE 105*   Lab Results  Component Value Date   CHOL 136 11/09/2015   HDL 53 11/09/2015   LDLCALC 44  11/09/2015   TRIG 194 (H) 11/09/2015   No results found for: [facility]   Radiology Studies    Dg Chest 2 View  Result Date: 10/02/2017 CLINICAL DATA:  Lethargy and near syncopal episode EXAM: CHEST - 2 VIEW COMPARISON:  09/30/2017 FINDINGS: Cardiomegaly is again seen. Aortic calcifications are noted and stable. No focal infiltrate or sizable effusion is seen. No acute bony abnormality is noted. IMPRESSION: No active cardiopulmonary  disease. Electronically Signed   By: Alcide CleverMark  Lukens M.D.   On: 10/02/2017 13:20   Dg Chest 2 View  Result Date: 09/29/2017 CLINICAL DATA:  Generalized weakness with chest pain. EXAM: CHEST - 2 VIEW COMPARISON:  04/18/2015 FINDINGS: Stable cardiomegaly with aortic atherosclerosis. Mild interstitial edema with left basilar atelectasis. No effusion or pneumothorax. No pulmonary consolidation. Osseous elements are stable with degenerative change about both shoulders and dorsal spine. IMPRESSION: Stable cardiomegaly with aortic atherosclerosis and mild interstitial edema. Left basilar atelectasis. Electronically Signed   By: Tollie Ethavid  Kwon M.D.   On: 09/29/2017 21:11   Ct Abdomen Pelvis W Contrast  Result Date: 10/02/2017 CLINICAL DATA:  Abdominal pain and lethargy EXAM: CT ABDOMEN AND PELVIS WITH CONTRAST TECHNIQUE: Multidetector CT imaging of the abdomen and pelvis was performed using the standard protocol following bolus administration of intravenous contrast. CONTRAST:  100mL ISOVUE-300 IOPAMIDOL (ISOVUE-300) INJECTION 61% COMPARISON:  April 18, 2015 FINDINGS: Lower chest: There is scarring in the left base. No lung base edema or consolidation. There is a stable pericardial effusion. There is a small hiatal hernia. Hepatobiliary: No focal liver lesions are evident. Gallbladder is absent. There is no appreciable biliary duct dilatation. Pancreas: No pancreatic mass or inflammatory focus. Spleen: No splenic lesions are evident. Adrenals/Urinary Tract: Adrenals bilaterally appear  unremarkable. There is scarring in the upper pole left kidney. There is a 1.2 x 0.8 cm cyst in the lateral mid right kidney. There is a 7 mm cyst arising from the anterior lower pole left kidney. No hydronephrosis is evident on either side. There is no renal or ureteral calculus on either side. Urinary bladder is midline with wall thickness within normal limits. Stomach/Bowel: There is extensive sigmoid diverticulosis without diverticulitis evident. More scattered diverticula elsewhere in the colon without diverticulitis. There is no appreciable bowel wall or mesenteric thickening. No evident bowel obstruction. No free air or portal venous air. No bowel pneumatosis. Vascular/Lymphatic: There is extensive aortoiliac atherosclerotic calcification. There are multiple areas of hemodynamically significant obstruction in the common and external iliac arteries bilaterally. There is also extensive internal iliac artery calcification. Major mesenteric arterial vessels are patent, although there is atherosclerotic calcification in these vessels. No adenopathy is appreciable in the abdomen or pelvis. Reproductive: Uterus is retroverted.  No evident pelvic mass. Other: Appendix appears normal. There is no ascites or abscess in the abdomen or pelvis. Slight soft tissue stranding is noted in the anterior wall of the upper pelvis without well-defined fluid or abscess. Musculoskeletal: There is degenerative change in the lower thoracic and lumbar regions. No evident blastic or lytic bone lesions. No intramuscular lesions are evident. IMPRESSION: 1. Colonic diverticula, most notably in the sigmoid region. No diverticulitis evident. No bowel pneumatosis or bowel obstruction. No abscess. Appendix appears normal. 2. No evident renal or ureteral calculus. No hydronephrosis. Scarring is noted in the upper pole left kidney posteriorly. 3. Extensive aortic and pelvic arterial atherosclerosis with areas of hemodynamically significant  obstruction at multiple sites in the pelvic arterial vessels. Extensive mesenteric arterial calcification without mesenteric arterial occlusion. 4.  Stable pericardial effusion. 5.  Small hiatal hernia. 6. Soft tissue stranding in the anterior wall of the upper pelvis on the right. No fluid collection or abscess in this area. Question prior trauma in this area with residual stranding. Electronically Signed   By: Bretta BangWilliam  Woodruff III M.D.   On: 10/02/2017 17:05   Dg Chest Port 1 View  Result Date: 09/30/2017 CLINICAL DATA:  Pulmonary edema. History of hypertension  and MI x2. Former smoker. EXAM: PORTABLE CHEST 1 VIEW COMPARISON:  Chest x-ray dated 09/29/2017 FINDINGS: Stable cardiomegaly. Mild central pulmonary vascular congestion which is likely chronic. Lungs otherwise clear. No confluent opacity to suggest a developing pneumonia. No pleural effusion or pneumothorax seen. No acute or suspicious osseous finding. IMPRESSION: No active disease. No evidence of pneumonia or pulmonary edema. Stable cardiomegaly. Electronically Signed   By: Bary Richard M.D.   On: 09/30/2017 09:14    ECG & Cardiac Imaging    EKG:  The EKG was personally reviewed and demonstrates SR with incomplete LBBB  Echo: 09/30/17  Study Conclusions  - Left ventricle: The cavity size was normal. There was moderate   concentric hypertrophy. Systolic function was normal. The   estimated ejection fraction was in the range of 60% to 65%.   Hypokinesis of the mid-apicalanteroseptal and inferoseptal   myocardium. Doppler parameters are consistent with abnormal left   ventricular relaxation (grade 1 diastolic dysfunction). Doppler   parameters are consistent with indeterminate ventricular filling   pressure. - Aortic valve: Transvalvular velocity was within the normal range.   There was no stenosis. There was no regurgitation. - Mitral valve: Transvalvular velocity was within the normal range.   There was no evidence for stenosis.  There was mild regurgitation. - Left atrium: The atrium was mildly dilated. - Right ventricle: The cavity size was normal. Wall thickness was   normal. Systolic function was normal. - Tricuspid valve: There was mild regurgitation. - Pulmonary arteries: Systolic pressure was within the normal   range. PA peak pressure: 28 mm Hg (S). - Pericardium, extracardiac: A large pericardial effusion was   identified along the left ventricular free wall measuring 2.6 cm.   Features were not consistent with tamponade physiology.  Assessment & Plan    84 yo female with PMH of HTN, HL, nonobstructive CAD and pericardial effusion who presented with syncope.   1. Syncope: Was sent home on daily lasix dose for her dyspnea and pericardial effusion. In talking with the granddaughter the patient was not eating or drinking well. Snacks throughout the day mostly. Suspect she is now over diuresed. Given her age may be more beneficial to have care givers monitor to her and use PRN lasix at home. Seems she will not do well with a daily diuretic as she is not consistent with her intake habits.   2. Pericardial effusion: Noted on echo earlier this week. No signs of tamponade on echo or exam. Given her age would not consider invasive intervention.   3. Delirum vs Dementia: Able to recall events prior to admission, but obviously has confusion. Not sure what her true baseline is. Moving forward may be beneficial to have discuss of goals of care, or palliative care involved.   Alyssa Coffin, NP-C Pager [phone] 10/03/2017, 12:22 PM   Agree with note by Laverda Page NP-C  Ms. Lecker was recently discharged earlier this week. She has a known large pericardial effusion without tamponade physiology.she was sent a low-dose diuretic. Her serum creatinine today is mildly elevated. I suspect that she has been over diuresed and his dry addition to having diminished by mouth intake. She denies chest pain or  shortness of breath. Exam is benign. Her neck veins are flat. She is resting comfortably with a heart rate in the 60s.at this point, I would treat her conservatively. She wants to go home and I do not have a problem with this. I would just goes of care potentially  involve palliative care as well.  Runell Gess, M.D., FACP, [facility], [facility] [address] [place], [place]  [address]  [phone] 10/03/2017 2:01 PM

## 2017-10-03 NOTE — Progress Notes (Signed)
Discussed discharge instructions and medications with patient and patients family. Both verbalized understanding with all questions answered. VSS. Pt discharging with walker and home health. Pt discharged home with family.  [facility] Makhia Vosler,RN

## 2017-10-13 ENCOUNTER — Other Ambulatory Visit: Payer: Self-pay | Admitting: Cardiology

## 2017-10-15 NOTE — Telephone Encounter (Signed)
REFILL 

## 2017-10-22 NOTE — Progress Notes (Signed)
Cardiology Office Note   Date:  10/23/2017   ID:  Alyssa Graves, DOB 08-Jan-1930, MRN [id number]  PCP:  Alyssa Penna, MD  Cardiologist:  Dr.Jordan  Chief Complaint  Patient presents with  . Shortness of Breath     History of Present Illness: Alyssa Graves is a 84 y.o. female who presents for post hospitalization follow up after admission for syncopal episode, dehydration and large pericardial effusion. Other history included non-obstructive CAD, Hypertension, Hyperlipidemia, GERD, arthritis and baseline dementia. Seen by Dr. Allyson Sabal on consultation on 10/03/2017 who reviewed the echocardiogram which revealed large effusion measuring 2.6 cm but features were not consistent with tamponade physiology. Given her age, intervention was not considered. Pericardial effusion was worrisome for malignancy a;though no definitive source was identified.    She is now in [facility] for recovery. She is very sedentary and wheelchair bound. She walks only to the bathroom and day room across from her room. She complains of resting dyspnea and exertional dyspnea. Her daughter who is with her states that she is weak and falls asleep easily. The patient is very HOH.   Past Medical History:  Diagnosis Date  . Arthritis   . GERD (gastroesophageal reflux disease)   . Glucose intolerance (impaired glucose tolerance)   . Heart attack (HCC)    x2  . HTN (hypertension)   . Hypercholesterolemia   . Hypothyroid     Past Surgical History:  Procedure Laterality Date  . CHOLECYSTECTOMY    . FOOT SURGERY    . hammer toes    . UMBILICAL HERNIA REPAIR       Current Outpatient Medications  Medication Sig Dispense Refill  . amLODipine (NORVASC) 5 MG tablet Take 1 tablet (5 mg total) by mouth at bedtime. 30 tablet 0  . aspirin EC 81 MG tablet Take 81 mg by mouth daily.    . Calcium Carbonate (CALCIUM 600 PO) Take 600 mg by mouth 2 (two) times daily.     . Cholecalciferol (VITAMIN D) 2000 UNITS tablet Take  2,000 Units by mouth daily at 12 noon.    . furosemide (LASIX) 20 MG tablet Take 1 tablet (20 mg total) by mouth every other day. AND PRN for ADDITIONAL weight gain of 3 lbs in 1 day or 5 lbs in 2 days 30 tablet 3  . levothyroxine (SYNTHROID, LEVOTHROID) 50 MCG tablet Take 50 mcg by mouth daily before breakfast.    . nitroGLYCERIN (NITROSTAT) 0.4 MG SL tablet Place 1 tablet (0.4 mg total) under the tongue every 5 (five) minutes as needed for chest pain. 25 tablet 3  . omeprazole (PRILOSEC) 20 MG capsule TAKE 1 CAPSULE BY MOUTH ONCE DAILY 90 capsule 1  . potassium chloride (K-DUR,KLOR-CON) 10 MEQ tablet Take 1 tablet (10 mEq total) by mouth daily as needed (when takes lasix). 30 tablet 0  . ramipril (ALTACE) 10 MG capsule TAKE 1 CAPSULE (10 MG TOTAL) BY MOUTH DAILY. 90 capsule 0  . rosuvastatin (CRESTOR) 20 MG tablet TAKE 1 TABLET BY MOUTH DAILY. 30 tablet 2   No current facility-administered medications for this visit.     Allergies:   Patient has no known allergies.    Social History:  The patient  reports that she has quit smoking. She has never used smokeless tobacco. She reports that she does not drink alcohol or use drugs.   Family History:  The patient's family history is not on file.    ROS: All other systems are reviewed and negative.  Unless otherwise mentioned in H&P    PHYSICAL EXAM: VS:  BP (!) 148/71   Pulse (!) 57   Ht  (1.6 m)   Wt 207 lb 6.4 oz (94.1 kg)   SpO2 (!) 86%   BMI 36.74 kg/m  , BMI Body mass index is 36.74 kg/m. GEN: Well nourished, well developed, in no acute distress  HEENT: normal  Neck: no JVD, carotid bruits, or masses Cardiac: RRR;  murmurs, rubs, or gallops,2+  edema in the feet and 1+-2+ pretibial edema  Respiratory:  Poor inspiratory effort, diminished in the bases. Mild tachypnea is noted at rest. 02 sat's at 87%-89% GI: soft, nontender, nondistended, + BS MS: no deformity or atrophy  Skin: warm and dry, no rash Neuro:  Strength and  sensation are diminished.  Psych: euthymic mood, flat affect,falls asleep easily.    EKG:  Not completed on this visit.   Recent Labs: 10/02/2017: ALT 18; B Natriuretic Peptide 70.5; BUN 29; Creatinine, Ser 1.56; Hemoglobin 10.2; Platelets 234; Potassium 4.3; Sodium 137    Lipid Panel    Component Value Date/Time   CHOL 136 11/09/2015 1014   TRIG 194 (H) 11/09/2015 1014   HDL 53 11/09/2015 1014   CHOLHDL 2.6 11/09/2015 1014   VLDL 39 (H) 11/09/2015 1014   LDLCALC 44 11/09/2015 1014   LDLDIRECT 172.9 04/23/2013 0858      Wt Readings from Last 3 Encounters:  10/23/17 207 lb 6.4 oz (94.1 kg)  10/03/17 194 lb 10.7 oz (88.3 kg)  10/01/17 197 lb 4.8 oz (89.5 kg)      Other studies Reviewed: Echo: 09/30/17  - Left ventricle: The cavity size was normal. There was moderate concentric hypertrophy. Systolic function was normal. The estimated ejection fraction was in the range of 60% to 65%. Hypokinesis of the mid-apicalanteroseptal and inferoseptal myocardium. Doppler parameters are consistent with abnormal left ventricular relaxation (grade 1 diastolic dysfunction). Doppler parameters are consistent with indeterminate ventricular filling pressure. - Aortic valve: Transvalvular velocity was within the normal range. There was no stenosis. There was no regurgitation. - Mitral valve: Transvalvular velocity was within the normal range. There was no evidence for stenosis. There was mild regurgitation. - Left atrium: The atrium was mildly dilated. - Right ventricle: The cavity size was normal. Wall thickness was normal. Systolic function was normal. - Tricuspid valve: There was mild regurgitation. - Pulmonary arteries: Systolic pressure was within the normal range. PA peak pressure: 28 mm Hg (S). - Pericardium, extracardiac: A large pericardial effusion was identified along the left ventricular free wall measuring 2.6 cm. Features were not consistent with  tamponade physiology.    ASSESSMENT AND PLAN:  1. Pericardial Effusion:  Large per echocardiogram on 09/30/2017. No tamponade. Will repeat echo for comparison due to worsening breathing status. No complaints of chest pain.   2. Dyspnea:  This is occurring at rest but also with minimal exertion. Multifactorial with deconditioning and mild CHF.  She has some evidence of fluid overload. I will increase lasix to 20 mg alternating with lasix 40 mg daily.. I have asked [facility] to check her O2 sat's without nail polish to get accurate recordings. She may need O2 supplement if 02 remains < 88% at rest.    3. Hypertension: Elevated today which may be related to volume overload. Continue current regimen for now with lisinopril. Follow up BMET in two weeks for assessment of kidney status.   4. Hypercholesterolemia: Continue Crestor as directed.   Current medicines are reviewed at  length with the patient today.    Labs/ tests ordered today include: BMET, echocardiogram.   Bettey Mare. Liborio Nixon, ANP, AACC   10/23/2017 10:03 AM    [facility] [address], [place], [place] [address] Phone: [phone]; Fax: [phone]

## 2017-10-23 ENCOUNTER — Encounter: Payer: Self-pay | Admitting: Adult Health

## 2017-10-23 ENCOUNTER — Ambulatory Visit (INDEPENDENT_AMBULATORY_CARE_PROVIDER_SITE_OTHER): Payer: Medicare Other | Admitting: Adult Health

## 2017-10-23 VITALS — BP 148/71 | HR 57 | Ht 63.0 in | Wt 207.4 lb

## 2017-10-23 DIAGNOSIS — I5032 Chronic diastolic (congestive) heart failure: Secondary | ICD-10-CM

## 2017-10-23 DIAGNOSIS — I313 Pericardial effusion (noninflammatory): Secondary | ICD-10-CM

## 2017-10-23 DIAGNOSIS — Z79899 Other long term (current) drug therapy: Secondary | ICD-10-CM

## 2017-10-23 DIAGNOSIS — R55 Syncope and collapse: Secondary | ICD-10-CM

## 2017-10-23 DIAGNOSIS — R0602 Shortness of breath: Secondary | ICD-10-CM | POA: Diagnosis not present

## 2017-10-23 DIAGNOSIS — I10 Essential (primary) hypertension: Secondary | ICD-10-CM | POA: Diagnosis not present

## 2017-10-23 DIAGNOSIS — I3139 Other pericardial effusion (noninflammatory): Secondary | ICD-10-CM

## 2017-10-23 MED ORDER — FUROSEMIDE 20 MG PO TABS: 20.0000 mg | ORAL_TABLET | ORAL | 3 refills | Status: DC

## 2017-10-23 MED ORDER — POTASSIUM CHLORIDE CRYS ER 10 MEQ PO TBCR: 10.0000 meq | EXTENDED_RELEASE_TABLET | Freq: Every day | ORAL | 0 refills | Status: DC | PRN

## 2017-10-23 NOTE — Patient Instructions (Addendum)
Medication Instructions:  START TAKING LASIX EVERY OTHER DAY  If you need a refill on your cardiac medications before your next appointment, please call your pharmacy.  Labwork: BMET, REPEAT IN 2 WEEKS (~ 11-06-17) HERE IN OUR OFFICE AT [facility]  Take the provided lab slips with you to the lab for your blood draw.   You will NOT need to fast   Testing/Procedures: Echocardiogram - Your physician has requested that you have an echocardiogram. Echocardiography is a painless test that uses sound waves to create images of your heart. It provides your doctor with information about the size and shape of your heart and how well your heart's chambers and valves are working. This procedure takes approximately one hour. There are no restrictions for this procedure. This will be performed at our [facility] location - [address], [address].   Special Instructions: MAKE SURE TO STAY HYDRATED   START TAKING O2 SATURATION WITH NO DARK FINGERNAIL POLISH    Follow-Up: Your physician wants you to follow-up in: 6 WEEKS WITH DR [place] -OR- KATHRYN LAWRENCE (NURSE PRACTIONIER), DNP,AACC IF PRIMARY CARDIOLOGIST IS UNAVAILABLE.    Thank you for choosing [facility]     ###-AFTER VISIT S/W KELLY @ [facility] CHANGED LASIX TO EOD  AND  ON  DAYS TAKE K+=  AND  DAYS TAKE K+-20MG   ###-KELLY WILL FAX BMET RESULTS IN ~3 WEEKS [phone] ATTN [facility]

## 2017-11-01 ENCOUNTER — Ambulatory Visit (HOSPITAL_COMMUNITY): Payer: Medicare Other | Attending: Cardiovascular Disease

## 2017-11-01 ENCOUNTER — Other Ambulatory Visit: Payer: Self-pay

## 2017-11-01 DIAGNOSIS — R0600 Dyspnea, unspecified: Secondary | ICD-10-CM | POA: Diagnosis not present

## 2017-11-01 DIAGNOSIS — E785 Hyperlipidemia, unspecified: Secondary | ICD-10-CM | POA: Insufficient documentation

## 2017-11-01 DIAGNOSIS — I313 Pericardial effusion (noninflammatory): Secondary | ICD-10-CM | POA: Insufficient documentation

## 2017-11-01 DIAGNOSIS — I2510 Atherosclerotic heart disease of native coronary artery without angina pectoris: Secondary | ICD-10-CM | POA: Diagnosis not present

## 2017-11-01 DIAGNOSIS — I10 Essential (primary) hypertension: Secondary | ICD-10-CM | POA: Insufficient documentation

## 2017-11-01 DIAGNOSIS — R55 Syncope and collapse: Secondary | ICD-10-CM

## 2017-11-01 MED ORDER — PERFLUTREN LIPID MICROSPHERE
1.0000 mL | INTRAVENOUS | Status: AC | PRN
Start: 2017-11-01 — End: 2017-11-01
  Administered 2017-11-01: 3 mL via INTRAVENOUS

## 2017-11-09 ENCOUNTER — Other Ambulatory Visit: Payer: Self-pay | Admitting: Cardiology

## 2017-11-09 NOTE — Telephone Encounter (Signed)
Rx request sent to pharmacy.  

## 2017-11-28 ENCOUNTER — Ambulatory Visit: Payer: Medicare Other | Admitting: Podiatry

## 2017-12-05 NOTE — Progress Notes (Signed)
Cardiology Office Note   Date:  12/06/2017   ID:  Alyssa Graves, DOB 01/14/1930, MRN [id number]  PCP:  [name], MD  Cardiologist:  Dr.Jordan   Chief Complaint  Patient presents with  . Hypertension  . Follow-up  . Congestive Heart Failure     History of Present Illness: Alyssa Graves is a 85 y.o. female who presents for ongoing assessment and management of syncope, hx of pericardial effusion, non-obstructive CAD, hypertension, hyperlipidemia, with other history to include GERD, Arthritis, and baseline dementia. She is currently a resident of [facility] very sedentary and wheelchair bound.   On last office visit on 10/23/2017 she had some evidence of fluid overload and associated dyspnea. It was uncertain if this was related to deconditioning and dependent edema. She was increased on her lasix from 20 mg daily to 20 mg daily alternating with 40 mg daily QOD.   The patient is now currently a resident of [facility] assisted [facility] in [place].  She comes today accompanied by her sons.  The patient has no complaints of dyspnea on exertion, is working with physical therapy and using a walker to ambulate.  She does have some pain in her left knee and has been recommended to have a knee brace placed.  The patient's blood pressure is elevated today, and have been elevated in the past as well.  She denies headaches dizziness or blurred vision.  Medications are being provided by the skilled nursing facility/assisted living facility.  Past Medical History:  Diagnosis Date  . Arthritis   . GERD (gastroesophageal reflux disease)   . Glucose intolerance (impaired glucose tolerance)   . Heart attack (HCC)    x2  . HTN (hypertension)   . Hypercholesterolemia   . Hypothyroid     Past Surgical History:  Procedure Laterality Date  . CHOLECYSTECTOMY    . FOOT SURGERY    . hammer toes    . UMBILICAL HERNIA REPAIR       Current Outpatient Medications  Medication  Sig Dispense Refill  . amLODipine (NORVASC) 5 MG tablet Take 1.5 tablets (7.5 mg total) by mouth at bedtime. 30 tablet 0  . aspirin EC 81 MG tablet Take 81 mg by mouth daily.    . Calcium Carbonate (CALCIUM 600 PO) Take 600 mg by mouth 2 (two) times daily.     . Cholecalciferol (VITAMIN D) 2000 UNITS tablet Take 2,000 Units by mouth daily at 12 noon.    . furosemide (LASIX) 20 MG tablet Take 1 tablet (20 mg total) by mouth every other day. & 40MG  EOD ON 20MG  DAYS TAKE K+= 10MG  AND 40MG  DAYS TAKE K+-20MG  30 tablet 3  . levothyroxine (SYNTHROID, LEVOTHROID) 50 MCG tablet Take 50 mcg by mouth daily before breakfast.    . nitroGLYCERIN (NITROSTAT) 0.4 MG SL tablet Place 1 tablet (0.4 mg total) under the tongue every 5 (five) minutes as needed for chest pain. 25 tablet 3  . omeprazole (PRILOSEC) 20 MG capsule TAKE 1 CAPSULE BY MOUTH ONCE DAILY 90 capsule 1  . potassium chloride (K-DUR,KLOR-CON) 10 MEQ tablet Take 1 tablet (10 mEq total) by mouth daily as needed (when takes lasix). RX's=LASIX TO EOD 20MG  AND 40MG  ON 20MG  DAYS TAKE K+= 10MG  AND 40MG  DAYS TAKE K+-20MG  30 tablet 0  . rosuvastatin (CRESTOR) 20 MG tablet TAKE 1 TABLET BY MOUTH DAILY. 30 tablet 2   No current facility-administered medications for this visit.     Allergies:   Patient has  no known allergies.    Social History:  The patient  reports that she has quit smoking. She has never used smokeless tobacco. She reports that she does not drink alcohol or use drugs.   Family History:  The patient's family history is not on file.    ROS: All other systems are reviewed and negative. Unless otherwise mentioned in H&P    PHYSICAL EXAM: VS:  BP (!) 180/78   Pulse 73   Wt 205 lb (93 kg)   BMI 36.31 kg/m  , BMI Body mass index is 36.31 kg/m. GEN: Well nourished, well developed, in no acute distress  HEENT: normal  Neck: no JVD, carotid bruits, or masses Cardiac: RRR; no murmurs, rubs, or gallops mild dependent edema  Respiratory:  Clear to auscultation bilaterally, normal work of breathing GI: soft, nontender, nondistended, + BS MS: no deformity or atrophy  Skin: warm and dry, no rash Neuro:  Strength and sensation are intact, hard of hearing.  Psych: euthymic mood, full affect   EKG:  Not completed this office visit.   Recent Labs: 10/02/2017: ALT 18; B Natriuretic Peptide 70.5; BUN 29; Creatinine, Ser 1.56; Hemoglobin 10.2; Platelets 234; Potassium 4.3; Sodium 137    Lipid Panel    Component Value Date/Time   CHOL 136 11/09/2015 1014   TRIG 194 (H) 11/09/2015 1014   HDL 53 11/09/2015 1014   CHOLHDL 2.6 11/09/2015 1014   VLDL 39 (H) 11/09/2015 1014   LDLCALC 44 11/09/2015 1014   LDLDIRECT 172.9 04/23/2013 0858      Wt Readings from Last 3 Encounters:  12/06/17 205 lb (93 kg)  2017/11/06 207 lb 6.4 oz (94.1 kg)  10/03/17 194 lb 10.7 oz (88.3 kg)      Other studies Reviewed: Echocardiogram 11-06-17  Echo:09/30/17  - Left ventricle: The cavity size was normal. There was moderate concentric hypertrophy. Systolic function was normal. The estimated ejection fraction was in the range of 60% to 65%. Hypokinesis of the mid-apicalanteroseptal and inferoseptal myocardium. Doppler parameters are consistent with abnormal left ventricular relaxation (grade 1 diastolic dysfunction). Doppler parameters are consistent with indeterminate ventricular filling pressure. - Aortic valve: Transvalvular velocity was within the normal range. There was no stenosis. There was no regurgitation. - Mitral valve: Transvalvular velocity was within the normal range. There was no evidence for stenosis. There was mild regurgitation. - Left atrium: The atrium was mildly dilated. - Right ventricle: The cavity size was normal. Wall thickness was normal. Systolic function was normal. - Tricuspid valve: There was mild regurgitation. - Pulmonary arteries: Systolic pressure was within the normal range. PA peak  pressure: 28 mm Hg (S). - Pericardium, extracardiac: A large pericardial effusion was identified along the left ventricular free wall measuring 2.6 cm. Features were not consistent with tamponade physiology.    ASSESSMENT AND PLAN:  1.  Hypertension: Blood pressure is elevated on this office visit.  I rechecked it and it continues to be elevated although lower, at 168/88.  The patient states that the people who help with physical therapy also have noticed that her blood pressure is more elevated.  I am going to increase her amlodipine to 7.5 mg daily to assist with better blood pressure control.  I have asked the skilled nursing facility to take her blood pressure daily and send [place] a weekly report.  2.  Chronic diastolic heart failure: The patient appears to be tolerating higher doses of Lasix as discussed above.  I am going to repeat her BMET to  evaluate her kidney function and response to medication adjustments.  3.  Anemia: It was noted during recent labs in April at that she was anemic.  Hemoglobin was 10.2.  Prior hemoglobin did show lower levels of around 9.  I am going to repeat her CBC with this blood draw to evaluate status.  Review of medications does not have her on any anticoagulation therapy but she is on enteric-coated aspirin 81 mg daily.  Doubt this dose would be causing her anemia.  She may need an anemia profile and to be followed by PCP if remains low.  4.  Hypothyroidism: Followed by PCP on thyroid replacement therapy.  Current medicines are reviewed at length with the patient today.    Labs/ tests ordered today include: CBC and BMET.  Bettey MareKathryn M. Liborio NixonLawrence DNP, ANP, AACC   12/06/2017 12:08 PM    [facility] [address], [place], [place] [address] Phone: [phone]; Fax: [phone]

## 2017-12-06 ENCOUNTER — Ambulatory Visit (INDEPENDENT_AMBULATORY_CARE_PROVIDER_SITE_OTHER): Payer: Medicare Other | Admitting: Adult Health

## 2017-12-06 ENCOUNTER — Encounter: Payer: Self-pay | Admitting: Adult Health

## 2017-12-06 VITALS — BP 180/78 | HR 73 | Wt 205.0 lb

## 2017-12-06 DIAGNOSIS — Z79899 Other long term (current) drug therapy: Secondary | ICD-10-CM

## 2017-12-06 DIAGNOSIS — D649 Anemia, unspecified: Secondary | ICD-10-CM

## 2017-12-06 DIAGNOSIS — I5032 Chronic diastolic (congestive) heart failure: Secondary | ICD-10-CM | POA: Diagnosis not present

## 2017-12-06 DIAGNOSIS — N289 Disorder of kidney and ureter, unspecified: Secondary | ICD-10-CM

## 2017-12-06 DIAGNOSIS — I10 Essential (primary) hypertension: Secondary | ICD-10-CM

## 2017-12-06 LAB — BASIC METABOLIC PANEL
BUN/Creatinine Ratio: 15 (ref 12–28)
BUN: 15 mg/dL (ref 8–27)
CHLORIDE: 105 mmol/L (ref 96–106)
CO2: 24 mmol/L (ref 20–29)
CREATININE: 1.01 mg/dL — AB (ref 0.57–1.00)
Calcium: 9.3 mg/dL (ref 8.7–10.3)
GFR calc Af Amer: 58 mL/min/{1.73_m2} — ABNORMAL LOW (ref >59)
GFR calc non Af Amer: 51 mL/min/{1.73_m2} — ABNORMAL LOW (ref >59)
GLUCOSE: 97 mg/dL (ref 65–99)
Potassium: 3.9 mmol/L (ref 3.5–5.2)
SODIUM: 142 mmol/L (ref 134–144)

## 2017-12-06 LAB — CBC
HEMOGLOBIN: 10.9 g/dL — AB (ref 11.1–15.9)
Hematocrit: 32.1 % — ABNORMAL LOW (ref 34.0–46.6)
MCH: 29.1 pg (ref 26.6–33.0)
MCHC: 34 g/dL (ref 31.5–35.7)
MCV: 86 fL (ref 79–97)
Platelets: 266 10*3/uL (ref 150–450)
RBC: 3.75 x10E6/uL — AB (ref 3.77–5.28)
RDW: 14.2 % (ref 12.3–15.4)
WBC: 4.5 10*3/uL (ref 3.4–10.8)

## 2017-12-06 MED ORDER — AMLODIPINE BESYLATE 5 MG PO TABS: 7.5000 mg | ORAL_TABLET | Freq: Every day | ORAL | 0 refills | Status: DC

## 2017-12-06 NOTE — Patient Instructions (Signed)
Medication Instructions:  INCREASE AMLODIPINE 7.5MG  DAILY (1-1/2 TAB)  If you need a refill on your cardiac medications before your next appointment, please call your pharmacy.  Labwork: BMET AND CBC TODAY HERE IN OUR OFFICE AT [facility]  Take the provided lab slips with you to the lab for your blood draw.   Follow-Up: Your physician wants you to follow-up in: 1 MONTH WITH DR Amanda CockayneJORDAN-OR- KATHRYN LAWRENCE (NURSE PRACTIONIER), DNP,AACC IF PRIMARY CARDIOLOGIST IS UNAVAILABLE.     Thank you for choosing [facility] at [facility]!!

## 2018-01-09 NOTE — Progress Notes (Signed)
Cardiology Office Note   Date:  01/10/2018   ID:  Alyssa JensenGirtha B Mangini, DOB 16-Dec-1929, MRN [id number]  PCP:  [name], Scott, MD  Cardiologist:  Dr. [place]az[place]Jordan  Chief Complaint  Patient presents with  . Hypertension  . Follow-up     History of Present Illness: Alyssa Graves is a 84 y.o. female who presents for ongoing assessment and management of syncope, hx of pericardial effusion, non-obstructive CAD, hypertension, hyperlipidemia, with other history to include GERD, Arthritis, and baseline dementia. She is currently a resident of [facility] very sedentary and wheelchair bound.   When I saw her last on 12/06/2017, she was found to be hypertensive. [facility] [address] [facility] [place], [place]. Due to elevated BP I increased amlodipine to 7.5 mg daily. She is was continued on lasix dose. BMET and CBC was ordered. She was found to be anemic on last check.  Labs: 12/06/2017 Na; 142, K= 3.9, Cl 105, CO2 24, BUN 15, Creatinine 1.01.  Hgb 10.9, Hct 32.1.Plts 266.   She is without complaints today. Very poor historian and hard of hearing. She denies dizziness or fatigue at this time. She continues to use rolling walker for ambulation.   Past Medical History:  Diagnosis Date  . Arthritis   . GERD (gastroesophageal reflux disease)   . Glucose intolerance (impaired glucose tolerance)   . Heart attack (HCC)    x2  . HTN (hypertension)   . Hypercholesterolemia   . Hypothyroid     Past Surgical History:  Procedure Laterality Date  . CHOLECYSTECTOMY    . FOOT SURGERY    . hammer toes    . UMBILICAL HERNIA REPAIR       Current Outpatient Medications  Medication Sig Dispense Refill  . amLODipine (NORVASC) 5 MG tablet Take 1.5 tablets (7.5 mg total) by mouth at bedtime. 30 tablet 0  . aspirin EC 81 MG tablet Take 81 mg by mouth daily.    . Calcium Carbonate (CALCIUM 600 PO) Take 600 mg by mouth 2 (two) times daily.     . Cholecalciferol (VITAMIN D) 2000 UNITS tablet Take 2,000  Units by mouth daily at 12 noon.    . furosemide (LASIX) 20 MG tablet Take 1 tablet (20 mg total) by mouth every other day. & 40MG  EOD ON 20MG  DAYS TAKE K+= 10MG  AND 40MG  DAYS TAKE K+-20MG  30 tablet 3  . levothyroxine (SYNTHROID, LEVOTHROID) 50 MCG tablet Take 50 mcg by mouth daily before breakfast.    . nitroGLYCERIN (NITROSTAT) 0.4 MG SL tablet Place 1 tablet (0.4 mg total) under the tongue every 5 (five) minutes as needed for chest pain. 25 tablet 3  . omeprazole (PRILOSEC) 20 MG capsule TAKE 1 CAPSULE BY MOUTH ONCE DAILY 90 capsule 1  . potassium chloride (K-DUR,KLOR-CON) 10 MEQ tablet Take 1 tablet (10 mEq total) by mouth daily as needed (when takes lasix). RX's=LASIX TO EOD 20MG  AND 40MG  ON 20MG  DAYS TAKE K+= 10MG  AND 40MG  DAYS TAKE K+-20MG  30 tablet 0  . rosuvastatin (CRESTOR) 20 MG tablet TAKE 1 TABLET BY MOUTH DAILY. 30 tablet 2   No current facility-administered medications for this visit.     Allergies:   Patient has no known allergies.    Social History:  The patient  reports that she has quit smoking. She has never used smokeless tobacco. She reports that she does not drink alcohol or use drugs.   Family History:  The patient's family history is not on file.  ROS: All other systems are reviewed and negative. Unless otherwise mentioned in H&P    PHYSICAL EXAM: VS:  BP 122/62   Pulse 73   Ht 5\' 3"  (1.6 m)   Wt 206 lb (93.4 kg)   BMI 36.49 kg/m  , BMI Body mass index is 36.49 kg/m. GEN: Well nourished, well developed, in no acute distress  HEENT: normal  Neck: no JVD, carotid bruits, or masses Cardiac: RRR; 1/6 systolic  Murmurs,no  rubs, or gallops,no edema  Respiratory:  Clear to auscultation bilaterally, normal work of breathing GI: soft, nontender, nondistended, + BS MS: no deformity or atrophy  Skin: warm and dry, no rash Neuro:  Strength and sensation are intact Psych: euthymic mood, full affect   EKG:  Not completed during this office visit.   Recent  Labs: 10/02/2017: ALT 18; B Natriuretic Peptide 70.5 12/06/2017: BUN 15; Creatinine, Ser 1.01; Hemoglobin 10.9; Platelets 266; Potassium 3.9; Sodium 142    Lipid Panel    Component Value Date/Time   CHOL 136 11/09/2015 1014   TRIG 194 (H) 11/09/2015 1014   HDL 53 11/09/2015 1014   CHOLHDL 2.6 11/09/2015 1014   VLDL 39 (H) 11/09/2015 1014   LDLCALC 44 11/09/2015 1014   LDLDIRECT 172.9 04/23/2013 0858      Wt Readings from Last 3 Encounters:  01/10/18 206 lb (93.4 kg)  12/06/17 205 lb (93 kg)  10/23/17 207 lb 6.4 oz (94.1 kg)      Other studies Reviewed: Echo:09/30/17  - Left ventricle: The cavity size was normal. There was moderate concentric hypertrophy. Systolic function was normal. The estimated ejection fraction was in the range of 60% to 65%. Hypokinesis of the mid-apicalanteroseptal and inferoseptal myocardium. Doppler parameters are consistent with abnormal left ventricular relaxation (grade 1 diastolic dysfunction). Doppler parameters are consistent with indeterminate ventricular filling pressure. - Aortic valve: Transvalvular velocity was within the normal range. There was no stenosis. There was no regurgitation. - Mitral valve: Transvalvular velocity was within the normal range. There was no evidence for stenosis. There was mild regurgitation. - Left atrium: The atrium was mildly dilated. - Right ventricle: The cavity size was normal. Wall thickness was normal. Systolic function was normal. - Tricuspid valve: There was mild regurgitation. - Pulmonary arteries: Systolic pressure was within the normal range. PA peak pressure: 28 mm Hg (S). - Pericardium, extracardiac: A large pericardial effusion was identified along the left ventricular free wall measuring 2.6 cm. Features were not consistent with tamponade physiology.   ASSESSMENT AND PLAN:  1. Hypertension: On last visit she was increased on amlodipine from 5 mg to 7.5 mg. She  has had marked improvement in her BP with this new dose. Will continue this dose as she is without complaints.   2. Anemia: This is persistent but not worsened.  A copy of her labs are provided to PCP.   3. Chronic Diastolic CHF: She does not appear to be volume overloaded at this time. Continue diuretic regimen.    Current medicines are reviewed at length with the patient today.    Labs/ tests ordered today include: None    Bettey Mare. Liborio Nixon, ANP, AACC   01/10/2018 11:01 AM    [facility] [address] [place], [place] [address] Phone: [phone]; Fax: [phone]

## 2018-01-10 ENCOUNTER — Ambulatory Visit (INDEPENDENT_AMBULATORY_CARE_PROVIDER_SITE_OTHER): Payer: Medicare Other | Admitting: Adult Health

## 2018-01-10 ENCOUNTER — Encounter: Payer: Self-pay | Admitting: Adult Health

## 2018-01-10 VITALS — BP 122/62 | HR 73 | Ht 63.0 in | Wt 206.0 lb

## 2018-01-10 DIAGNOSIS — I5032 Chronic diastolic (congestive) heart failure: Secondary | ICD-10-CM

## 2018-01-10 DIAGNOSIS — I10 Essential (primary) hypertension: Secondary | ICD-10-CM

## 2018-01-10 DIAGNOSIS — D649 Anemia, unspecified: Secondary | ICD-10-CM | POA: Diagnosis not present

## 2018-01-10 NOTE — Patient Instructions (Signed)
Medication Instructions:  NO CHANGES- Your physician recommends that you continue on your current medications as directed. Please refer to the Current Medication list given to you today.  If you need a refill on your cardiac medications before your next appointment, please call your pharmacy.  Follow-Up: Your physician wants you to follow-up in: 6 MONTHS WITH DR Swazil[place]ORDAN You should receive a reminder letter in the mail two months in advance. If you do not receive a letter, please call our office NOC 2019 to schedule the JAN 2020 follow-up appointment.   Thank you for choosing [facility]!!

## 2018-05-01 ENCOUNTER — Ambulatory Visit: Payer: Medicare Other | Admitting: Podiatry

## 2018-05-10 ENCOUNTER — Ambulatory Visit (INDEPENDENT_AMBULATORY_CARE_PROVIDER_SITE_OTHER): Payer: Medicare Other | Admitting: Podiatry

## 2018-05-10 ENCOUNTER — Encounter: Payer: Self-pay | Admitting: Podiatry

## 2018-05-10 DIAGNOSIS — Q828 Other specified congenital malformations of skin: Secondary | ICD-10-CM

## 2018-05-10 DIAGNOSIS — M79676 Pain in unspecified toe(s): Secondary | ICD-10-CM

## 2018-05-10 DIAGNOSIS — M79672 Pain in left foot: Secondary | ICD-10-CM

## 2018-05-10 DIAGNOSIS — M79671 Pain in right foot: Secondary | ICD-10-CM | POA: Diagnosis not present

## 2018-05-10 DIAGNOSIS — B351 Tinea unguium: Secondary | ICD-10-CM

## 2018-05-10 NOTE — Patient Instructions (Addendum)

## 2018-06-01 ENCOUNTER — Encounter: Payer: Self-pay | Admitting: Podiatry

## 2018-06-01 NOTE — Progress Notes (Signed)
Subjective: Alyssa Graves presents today with painful, thick toenails 1-5 b/l and painful calluses that she cannot cut and which interfere with daily activities.  Pain is aggravated when wearing enclosed shoe gear.  [name], Scott, MD is her PCP.   Current Outpatient Medications:  .  amLODipine (NORVASC) 5 MG tablet, Take 1.5 tablets (7.5 mg total) by mouth at bedtime., Disp: 30 tablet, Rfl: 0 .  aspirin EC 81 MG tablet, Take 81 mg by mouth daily., Disp: , Rfl:  .  Calcium Carbonate (CALCIUM 600 PO), Take 600 mg by mouth 2 (two) times daily. , Disp: , Rfl:  .  Cholecalciferol (VITAMIN D) 2000 UNITS tablet, Take 2,000 Units by mouth daily at 12 noon., Disp: , Rfl:  .  furosemide (LASIX) 20 MG tablet, Take 1 tablet (20 mg total) by mouth every other day. & 40MG  EOD ON 20MG  DAYS TAKE K+= 10MG  AND 40MG  DAYS TAKE K+-20MG , Disp: 30 tablet, Rfl: 3 .  levothyroxine (SYNTHROID, LEVOTHROID) 50 MCG tablet, Take 50 mcg by mouth daily before breakfast., Disp: , Rfl:  .  nitroGLYCERIN (NITROSTAT) 0.4 MG SL tablet, Place 1 tablet (0.4 mg total) under the tongue every 5 (five) minutes as needed for chest pain., Disp: 25 tablet, Rfl: 3 .  omeprazole (PRILOSEC) 20 MG capsule, TAKE 1 CAPSULE BY MOUTH ONCE DAILY, Disp: 90 capsule, Rfl: 1 .  potassium chloride (K-DUR,KLOR-CON) 10 MEQ tablet, Take 1 tablet (10 mEq total) by mouth daily as needed (when takes lasix). RX's=LASIX TO EOD 20MG  AND 40MG  ON 20MG  DAYS TAKE K+= 10MG  AND 40MG  DAYS TAKE K+-20MG , Disp: 30 tablet, Rfl: 0 .  rosuvastatin (CRESTOR) 20 MG tablet, TAKE 1 TABLET BY MOUTH DAILY., Disp: 30 tablet, Rfl: 2  No Known Allergies  Objective:  Vascular Examination: Capillary refill time immediate x 10 digits Dorsalis pedis and Posterior tibial pulses palpable b/l Digital hair present x 10 digits Skin temperature gradient WNL b/l  Dermatological Examination: Skin with normal turgor, texture and tone b/l  Toenails 1-5 b/l discolored, thick,  dystrophic with subungual debris and pain with palpation to nailbeds due to thickness of nails.  Porokeratosis submet head 3 b/l and submet head 1 right foot with tenderness to palpation. No erythema, no edema, no drainage.   Musculoskeletal: Muscle strength 5/5 to all LE muscle groups  Neurological: Sensation intact with 10 gram monofilament. Vibratory sensation intact.  Assessment: 1. Painful onychomycosis toenails 1-5 b/l  2. Porokeratoses submet head 3 b/l and submet head 1 right foot  Plan: 1. Toenails 1-5 b/l were debrided in length and girth without iatrogenic bleeding. 2. Porokeratoses enucleated and pared submet head 3 b/l and submet head 1 right foot 3. Patient to continue soft, supportive shoe gear 4. Patient to report any pedal injuries to medical professional immediately. 5. Follow up 3 months. Patient/POA to call should there be a concern in the interim.

## 2018-07-29 NOTE — Progress Notes (Deleted)
Cardiology Office Note   Date:  07/29/2018   ID:  Alyssa JensenGirtha B Hodgkin, DOB 07-02-1929, MRN [id number]  PCP:  [name], Scott, MD  Cardiologist:  Dr. SwazilandJordan  No chief complaint on file.    History of Present Illness: Alyssa Graves is a 85 y.o. female who presents for ongoing assessment and management of syncope,pericardial effusion and HTN. She has HTN, HLD and glucose intolerance. Reports past MI but past cardiac evaluation in 2010 with cardiac cath showed minor nonobstructive CAD and normal EF. Myoview from 2011 was normal. She has a history of dementia.  She is currently a resident of [facility] very sedentary and wheelchair bound. She was seen in the hospital in April with syncope. Echo showed a large pericardial effusion without tamponade. She was felt to be dehydrated contributing to syncope. No further treatment for effusion recommended. Repeat Echo one month later showed effusion was improved and only small.   She is without complaints today. Very poor historian and hard of hearing. She denies dizziness or fatigue at this time. She continues to use rolling walker for ambulation.   Past Medical History:  Diagnosis Date  . Arthritis   . GERD (gastroesophageal reflux disease)   . Glucose intolerance (impaired glucose tolerance)   . Heart attack (HCC)    x2  . HTN (hypertension)   . Hypercholesterolemia   . Hypothyroid     Past Surgical History:  Procedure Laterality Date  . CHOLECYSTECTOMY    . FOOT SURGERY    . hammer toes    . UMBILICAL HERNIA REPAIR       Current Outpatient Medications  Medication Sig Dispense Refill  . amLODipine (NORVASC) 5 MG tablet Take 1.5 tablets (7.5 mg total) by mouth at bedtime. 30 tablet 0  . aspirin EC 81 MG tablet Take 81 mg by mouth daily.    . Calcium Carbonate (CALCIUM 600 PO) Take 600 mg by mouth 2 (two) times daily.     . Cholecalciferol (VITAMIN D) 2000 UNITS tablet Take 2,000 Units by mouth daily at 12 noon.    . furosemide (LASIX)  20 MG tablet Take 1 tablet (20 mg total) by mouth every other day. & 40MG  EOD ON 20MG  DAYS TAKE K+= 10MG  AND 40MG  DAYS TAKE K+-20MG  30 tablet 3  . levothyroxine (SYNTHROID, LEVOTHROID) 50 MCG tablet Take 50 mcg by mouth daily before breakfast.    . nitroGLYCERIN (NITROSTAT) 0.4 MG SL tablet Place 1 tablet (0.4 mg total) under the tongue every 5 (five) minutes as needed for chest pain. 25 tablet 3  . omeprazole (PRILOSEC) 20 MG capsule TAKE 1 CAPSULE BY MOUTH ONCE DAILY 90 capsule 1  . potassium chloride (K-DUR,KLOR-CON) 10 MEQ tablet Take 1 tablet (10 mEq total) by mouth daily as needed (when takes lasix). RX's=LASIX TO EOD 20MG  AND 40MG  ON 20MG  DAYS TAKE K+= 10MG  AND 40MG  DAYS TAKE K+-20MG  30 tablet 0  . rosuvastatin (CRESTOR) 20 MG tablet TAKE 1 TABLET BY MOUTH DAILY. 30 tablet 2   No current facility-administered medications for this visit.     Allergies:   Patient has no known allergies.    Social History:  The patient  reports that she has quit smoking. She has never used smokeless tobacco. She reports that she does not drink alcohol or use drugs.   Family History:  The patient's family history is not on file.    ROS: All other systems are reviewed and negative. Unless otherwise mentioned in H&P  PHYSICAL EXAM: VS:  There were no vitals taken for this visit. , BMI There is no height or weight on file to calculate BMI. GEN: Well nourished, well developed, in no acute distress  HEENT: normal  Neck: no JVD, carotid bruits, or masses Cardiac: RRR; 1/6 systolic  Murmurs,no  rubs, or gallops,no edema  Respiratory:  Clear to auscultation bilaterally, normal work of breathing GI: soft, nontender, nondistended, + BS MS: no deformity or atrophy  Skin: warm and dry, no rash Neuro:  Strength and sensation are intact Psych: euthymic mood, full affect   EKG:  Not completed during this office visit.   Recent Labs: 10/02/2017: ALT 18; B Natriuretic Peptide 70.5 12/06/2017: BUN 15;  Creatinine, Ser 1.01; Hemoglobin 10.9; Platelets 266; Potassium 3.9; Sodium 142    Lipid Panel    Component Value Date/Time   CHOL 136 11/09/2015 1014   TRIG 194 (H) 11/09/2015 1014   HDL 53 11/09/2015 1014   CHOLHDL 2.6 11/09/2015 1014   VLDL 39 (H) 11/09/2015 1014   LDLCALC 44 11/09/2015 1014   LDLDIRECT 172.9 04/23/2013 0858      Wt Readings from Last 3 Encounters:  01/10/18 206 lb (93.4 kg)  12/06/17 205 lb (93 kg)  10/23/17 207 lb 6.4 oz (94.1 kg)      Other studies Reviewed: Echo:09/30/17  - Left ventricle: The cavity size was normal. There was moderate concentric hypertrophy. Systolic function was normal. The estimated ejection fraction was in the range of 60% to 65%. Hypokinesis of the mid-apicalanteroseptal and inferoseptal myocardium. Doppler parameters are consistent with abnormal left ventricular relaxation (grade 1 diastolic dysfunction). Doppler parameters are consistent with indeterminate ventricular filling pressure. - Aortic valve: Transvalvular velocity was within the normal range. There was no stenosis. There was no regurgitation. - Mitral valve: Transvalvular velocity was within the normal range. There was no evidence for stenosis. There was mild regurgitation. - Left atrium: The atrium was mildly dilated. - Right ventricle: The cavity size was normal. Wall thickness was normal. Systolic function was normal. - Tricuspid valve: There was mild regurgitation. - Pulmonary arteries: Systolic pressure was within the normal range. PA peak pressure: 28 mm Hg (S). - Pericardium, extracardiac: A large pericardial effusion was identified along the left ventricular free wall measuring 2.6 cm. Features were not consistent with tamponade physiology.  Echo 11/01/17: Study Conclusions  - Left ventricle: The cavity size was normal. Wall thickness was   increased in a pattern of severe LVH. Systolic function was   normal. The estimated  ejection fraction was in the range of 60%   to 65%. Features are consistent with a pseudonormal left   ventricular filling pattern, with concomitant abnormal relaxation   and increased filling pressure (grade 2 diastolic dysfunction). - Left atrium: The atrium was severely dilated. - Right atrium: The atrium was mildly dilated. - Pericardium, extracardiac: A small pericardial effusion was   identified. There was no evidence of hemodynamic compromise.  ASSESSMENT AND PLAN:  1. Hypertension: On last visit she was increased on amlodipine from 5 mg to 7.5 mg. She has had marked improvement in her BP with this new dose. Will continue this dose as she is without complaints.   2. Anemia: This is persistent but not worsened.  A copy of her labs are provided to PCP.   3. Chronic Diastolic CHF: She does not appear to be volume overloaded at this time. Continue diuretic regimen.   4. Pericardial effusion.  Current medicines are reviewed at length with  the patient today.    Labs/ tests ordered today include: None     [place]azil[place] MD, [facility]     07/29/2018 1:07 PM    [facility]

## 2018-08-01 ENCOUNTER — Ambulatory Visit: Payer: Medicare Other | Admitting: Cardiology

## 2018-08-09 ENCOUNTER — Ambulatory Visit: Payer: Medicare Other | Admitting: Podiatry

## 2018-08-30 ENCOUNTER — Ambulatory Visit: Payer: Medicare Other | Admitting: Podiatry

## 2018-09-27 ENCOUNTER — Ambulatory Visit: Payer: Medicare Other | Admitting: Cardiology

## 2018-12-09 ENCOUNTER — Telehealth: Payer: Self-pay | Admitting: Adult Health

## 2018-12-09 NOTE — Telephone Encounter (Signed)
Called facility to see if patient was having a virtual visit or office visit.  The facility will call back with that information.

## 2018-12-09 NOTE — Progress Notes (Signed)
Cardiology Office Note   Date:  12/10/2018   ID:  Alyssa Graves, DOB 1930-05-30, MRN [id number]  PCP:  [name], Scott, MD  Cardiologist: Dr. SwazilandJordan  Chief Complaint: Chronic LEE    History of Present Illness: Alyssa Graves is a 85 y.o. female who presents for ongoing assessment and management of syncope, hx of pericardial effusion, non-obstructive CAD, hypertension, hyperlipidemia, with other history to include GERD, Arthritis, and baseline dementia. She is currently a resident of [facility] very sedentary and wheelchair bound.   On last office visit, amlodipine was increased to 7.5 mg daily with marked improvement in BP control. She was stable and without complaint.   She has significant dementia and is only accompanied by transport driver from SNF. She falls asleep during our conversation. When she answers questions she denies shortness of breath or chest pain. Main complaint is significant LEE of the feet and ankles. She denies pain unless they are touched. She is able to walk with a walker.    Past Medical History:  Diagnosis Date  . Arthritis   . GERD (gastroesophageal reflux disease)   . Glucose intolerance (impaired glucose tolerance)   . Heart attack (HCC)    x2  . HTN (hypertension)   . Hypercholesterolemia   . Hypothyroid     Past Surgical History:  Procedure Laterality Date  . CHOLECYSTECTOMY    . FOOT SURGERY    . hammer toes    . UMBILICAL HERNIA REPAIR       Current Outpatient Medications  Medication Sig Dispense Refill  . metoprolol tartrate (LOPRESSOR) 25 MG tablet Take 25 mg by mouth 2 (two) times daily.    Marland Kitchen. thiamine (VITAMIN B-1) 100 MG tablet Take 100 mg by mouth daily.    Marland Kitchen. amLODipine (NORVASC) 5 MG tablet Take 1.5 tablets (7.5 mg total) by mouth at bedtime. 30 tablet 0  . aspirin EC 81 MG tablet Take 81 mg by mouth daily.    . Calcium Carbonate (CALCIUM 600 PO) Take 600 mg by mouth 2 (two) times daily.     . Cholecalciferol (VITAMIN D) 2000  UNITS tablet Take 2,000 Units by mouth daily at 12 noon.    . furosemide (LASIX) 20 MG tablet Take 1 tablet (20 mg total) by mouth every other day. & 40MG  EOD ON 20MG  DAYS TAKE K+= 10MG  AND 40MG  DAYS TAKE K+-20MG  30 tablet 3  . levothyroxine (SYNTHROID, LEVOTHROID) 50 MCG tablet Take 50 mcg by mouth daily before breakfast.    . nitroGLYCERIN (NITROSTAT) 0.4 MG SL tablet Place 1 tablet (0.4 mg total) under the tongue every 5 (five) minutes as needed for chest pain. 25 tablet 3  . omeprazole (PRILOSEC) 20 MG capsule TAKE 1 CAPSULE BY MOUTH ONCE DAILY 90 capsule 1  . potassium chloride (K-DUR,KLOR-CON) 10 MEQ tablet Take 1 tablet (10 mEq total) by mouth daily as needed (when takes lasix). RX's=LASIX TO EOD 20MG  AND 40MG  ON 20MG  DAYS TAKE K+= 10MG  AND 40MG  DAYS TAKE K+-20MG  30 tablet 0  . rosuvastatin (CRESTOR) 20 MG tablet TAKE 1 TABLET BY MOUTH DAILY. 30 tablet 2   No current facility-administered medications for this visit.     Allergies:   Patient has no known allergies.    Social History:  The patient  reports that she has quit smoking. She has never used smokeless tobacco. She reports that she does not drink alcohol or use drugs.   Family History:  The patient's family history is not on file.  ROS: All other systems are reviewed and negative. Unless otherwise mentioned in H&P    PHYSICAL EXAM: VS:  Pulse (!) 53   Wt 198 lb (89.8 kg)   BMI 35.07 kg/m  , BMI Body mass index is 35.07 kg/m. GEN: Well nourished, well developed, in no acute distress HEENT: normal Neck: no JVD, carotid bruits, or masses Cardiac: RRR; 1/6 systolic murmurs, rubs, or gallops,2+-3+ pitting  Edema of the feet and ankles bilaterally.   Respiratory:  Clear to auscultation bilaterally, normal work of breathing GI: soft, nontender, nondistended, + BS MS: no deformity or atrophy Skin: warm and dry, no rash Neuro:  Strength and sensation are intact Psych: Dementia with inability to understand and answer  questions.    EKG: Not completed this office visit.   Recent Labs: No results found for requested labs within last 8760 hours.    Lipid Panel    Component Value Date/Time   CHOL 136 11/09/2015 1014   TRIG 194 (H) 11/09/2015 1014   HDL 53 11/09/2015 1014   CHOLHDL 2.6 11/09/2015 1014   VLDL 39 (H) 11/09/2015 1014   LDLCALC 44 11/09/2015 1014   LDLDIRECT 172.9 04/23/2013 0858      Wt Readings from Last 3 Encounters:  12/10/18 198 lb (89.8 kg)  01/10/18 206 lb (93.4 kg)  12/06/17 205 lb (93 kg)      Other studies Reviewed:  Echo:09/30/17 - Left ventricle: The cavity size was normal. There was moderate concentric hypertrophy. Systolic function was normal. The estimated ejection fraction was in the range of 60% to 65%. Hypokinesis of the mid-apicalanteroseptal and inferoseptal myocardium. Doppler parameters are consistent with abnormal left ventricular relaxation (grade 1 diastolic dysfunction). Doppler parameters are consistent with indeterminate ventricular filling pressure. - Aortic valve: Transvalvular velocity was within the normal range. There was no stenosis. There was no regurgitation. - Mitral valve: Transvalvular velocity was within the normal range. There was no evidence for stenosis. There was mild regurgitation. - Left atrium: The atrium was mildly dilated. - Right ventricle: The cavity size was normal. Wall thickness was normal. Systolic function was normal. - Tricuspid valve: There was mild regurgitation. - Pulmonary arteries: Systolic pressure was within the normal range. PA peak pressure: 28 mm Hg (S). - Pericardium, extracardiac: A large pericardial effusion was identified along the left ventricular free wall measuring 2.6 cm. Features were not consistent with tamponade physiology  ASSESSMENT AND PLAN:  1.  Chronic LEE: Multifactorial due to dependent edema, CCB, and CKD. She is on lasix in divided doses with different  doses on different days. Lasix 20 mg alternating with 40 mg with potassium supplement. Last creatinine was completed one year ago with 1.01 She has not been seen by nephrology. Would recommend further evaluation by them before increasing lasix. dose. Could go up to 40 mg daily. She is to keep legs elevated as much as possible and adhere to low sodium diet.   2. Anemia: Likely related to CKD.  Do not see that she is on iron supplement. May need work up per PCP.   3. Hypertension: Slightly elevated today. Will continue current regimen. She is to adhere to low sodium diet. Continue amlodipine and metoprolol.   4. Hypercholesterolemia: Continue Crestor as directed.     Current medicines are reviewed at length with the patient today.    Labs/ tests ordered today include:  Phill Myron. West Pugh, ANP, AACC   12/10/2018 10:04 AM    [facility] [address]  [address] Office [phone] Fax [phone]

## 2018-12-10 ENCOUNTER — Other Ambulatory Visit: Payer: Self-pay

## 2018-12-10 ENCOUNTER — Encounter: Payer: Self-pay | Admitting: Adult Health

## 2018-12-10 ENCOUNTER — Ambulatory Visit (INDEPENDENT_AMBULATORY_CARE_PROVIDER_SITE_OTHER): Payer: Medicare Other | Admitting: Adult Health

## 2018-12-10 VITALS — BP 152/68 | HR 53 | Ht 64.0 in | Wt 198.0 lb

## 2018-12-10 DIAGNOSIS — R600 Localized edema: Secondary | ICD-10-CM | POA: Diagnosis not present

## 2018-12-10 DIAGNOSIS — I10 Essential (primary) hypertension: Secondary | ICD-10-CM

## 2018-12-10 NOTE — Patient Instructions (Signed)
Medication Instructions:  NO CHANGES- Your physician recommends that you continue on your current medications as directed. Please refer to the Current Medication list given to you today. If you need a refill on your cardiac medications before your next appointment, please call your pharmacy.  Special Instructions: CALL AND SCHEDULE A FOLLOW UP APPOINTMENT WITH NEPHROLOGY  ELEVATE LOWER EXTREMITIES AS OFTEN AS POSSIBLE  Follow-Up: You will need a follow up appointment in 6 months.  Please call our office 2 months in advance, October 2020 to schedule this, December 2020 appointment.  You may see Peter Martinique, MD , [facility],DNP or one of the following Advanced Practice Providers on your designated Care Team:  Almyra Deforest, PA-C  Fabian Sharp, PA-C      At [facility], you and your health needs are our priority.  As part of our continuing mission to provide you with exceptional heart care, we have created designated Provider Care Teams.  These Care Teams include your primary Cardiologist (physician) and Advanced Practice Providers (APPs -  Physician Assistants and Nurse Practitioners) who all work together to provide you with the care you need, when you need it.  Thank you for choosing [facility] at [facility]!!

## 2019-02-09 NOTE — Progress Notes (Deleted)
Alyssa Graves Date of Birth: 1929/12/27 Medical Record #[id number]  History of Present Illness: [name]  presents for ongoing assessment and management of syncope, hx of pericardial effusion, non-obstructive CAD, hypertension, hyperlipidemia, with other history to include GERD, Arthritis, and baseline dementia. She is currently a resident of [facility] very sedentary and wheelchair bound. Reports past MI but past cardiac evaluation in 2010 with cardiac cath showed minor nonobstructive CAD and normal EF. Myoview from 2011 was normal. No history of stroke or TIA. Intolerant of nitrates in the past due to HA. Seen in June with LE edema. Felt to be multifactorial.   Seen today with her daughter. Remains very hard of hearing. She is doing well. No chest pain. Not short of breath.  Tolerating her medicines - daughter continues to manage her medicines. She complains today of pain in her right shoulder radiating down her arm. Hurts to move it.   Current Outpatient Medications  Medication Sig Dispense Refill  . amLODipine (NORVASC) 5 MG tablet Take 1.5 tablets (7.5 mg total) by mouth at bedtime. 30 tablet 0  . aspirin EC 81 MG tablet Take 81 mg by mouth daily.    . Calcium Carbonate (CALCIUM 600 PO) Take 600 mg by mouth 2 (two) times daily.     . Cholecalciferol (VITAMIN D) 2000 UNITS tablet Take 2,000 Units by mouth daily at 12 noon.    . furosemide (LASIX) 20 MG tablet Take 1 tablet (20 mg total) by mouth every other day. & 40MG  EOD ON 20MG  DAYS TAKE K+= 10MG  AND 40MG  DAYS TAKE K+-20MG  30 tablet 3  . levothyroxine (SYNTHROID, LEVOTHROID) 50 MCG tablet Take 50 mcg by mouth daily before breakfast.    . metoprolol tartrate (LOPRESSOR) 25 MG tablet Take 25 mg by mouth 2 (two) times daily.    . nitroGLYCERIN (NITROSTAT) 0.4 MG SL tablet Place 1 tablet (0.4 mg total) under the tongue every 5 (five) minutes as needed for chest pain. 25 tablet 3  . omeprazole (PRILOSEC) 20 MG capsule TAKE 1 CAPSULE BY  MOUTH ONCE DAILY 90 capsule 1  . potassium chloride (K-DUR,KLOR-CON) 10 MEQ tablet Take 1 tablet (10 mEq total) by mouth daily as needed (when takes lasix). RX's=LASIX TO EOD 20MG  AND 40MG  ON 20MG  DAYS TAKE K+= 10MG  AND 40MG  DAYS TAKE K+-20MG  30 tablet 0  . rosuvastatin (CRESTOR) 20 MG tablet TAKE 1 TABLET BY MOUTH DAILY. 30 tablet 2  . thiamine (VITAMIN B-1) 100 MG tablet Take 100 mg by mouth daily.     No current facility-administered medications for this visit.     No Known Allergies  Past Medical History:  Diagnosis Date  . Arthritis   . GERD (gastroesophageal reflux disease)   . Glucose intolerance (impaired glucose tolerance)   . Heart attack ([place])    x2  . HTN (hypertension)   . Hypercholesterolemia   . Hypothyroid     Past Surgical History:  Procedure Laterality Date  . CHOLECYSTECTOMY    . FOOT SURGERY    . hammer toes    . UMBILICAL HERNIA REPAIR      Social History   Tobacco Use  Smoking Status Former Smoker  Smokeless Tobacco Never Used    Social History   Substance and Sexual Activity  Alcohol Use No  . Alcohol/week: 0.0 standard drinks    Family History  Problem Relation Age of Onset  . Heart attack Neg Hx     Review of Systems: The review of systems  is per the HPI.  All other systems were reviewed and are negative.  Physical Exam: There were no vitals taken for this visit. Patient is very pleasant and in no acute distress. Very hard of hearing. Skin is warm and dry. Color is normal.  HEENT is unremarkable. Normocephalic/atraumatic. PERRL. Sclera are nonicteric. Neck is supple. No masses. No JVD. Lungs are clear. Cardiac exam shows a regular rate and rhythm. Abdomen is soft. Extremities show trace edema.  She does have pain on palpation and movement of right shoulder. Gait and ROM are intact. No gross neurologic deficits noted.  Wt Readings from Last 3 Encounters:  12/10/18 198 lb (89.8 kg)  01/10/18 206 lb (93.4 kg)  12/06/17 205 lb (93 kg)     LABORATORY DATA/PROCEDURES:  Lab Results  Component Value Date   WBC 4.5 12/06/2017   HGB 10.9 (L) 12/06/2017   HCT 32.1 (L) 12/06/2017   PLT 266 12/06/2017   GLUCOSE 97 12/06/2017   CHOL 136 11/09/2015   TRIG 194 (H) 11/09/2015   HDL 53 11/09/2015   LDLDIRECT 172.9 04/23/2013   LDLCALC 44 11/09/2015   ALT 18 10/02/2017   AST 29 10/02/2017   NA 142 12/06/2017   K 3.9 12/06/2017   CL 105 12/06/2017   CREATININE 1.01 (H) 12/06/2017   BUN 15 12/06/2017   CO2 24 12/06/2017   INR 1.2 02/13/2009   HGBA1C 6.6 (H) 04/23/2013    BNP (last 3 results) No results for input(s): PROBNP in the last 8760 hours.  Labs dated 11/24/16: Cholesterol 131, triglycerides 159, HDL 35, LDL 64. A1c 5.8%. Creatinine 1.3. Other chemistries and TSH normal.   Echo:09/30/17 - Left ventricle: The cavity size was normal. There was moderate concentric hypertrophy. Systolic function was normal. The estimated ejection fraction was in the range of 60% to 65%. Hypokinesis of the mid-apicalanteroseptal and inferoseptal myocardium. Doppler parameters are consistent with abnormal left ventricular relaxation (grade 1 diastolic dysfunction). Doppler parameters are consistent with indeterminate ventricular filling pressure. - Aortic valve: Transvalvular velocity was within the normal range. There was no stenosis. There was no regurgitation. - Mitral valve: Transvalvular velocity was within the normal range. There was no evidence for stenosis. There was mild regurgitation. - Left atrium: The atrium was mildly dilated. - Right ventricle: The cavity size was normal. Wall thickness was normal. Systolic function was normal. - Tricuspid valve: There was mild regurgitation. - Pulmonary arteries: Systolic pressure was within the normal range. PA peak pressure: 28 mm Hg (S). - Pericardium, extracardiac: A large pericardial effusion was identified along the left ventricular free wall measuring  2.6 cm. Features were not consistent with tamponade physiology  Assessment / Plan: 1. HTN - BP is well controlled. Continue current therapy.   2. HLD - on statin therapy. Well controlled.  3. Minor nonobstructive CAD per cath with negative Myoview in 2011. Aortic atherosclerosis noted on prior CT. Continue risk factor modification  4. Edema. Improved.   See back in one year.

## 2019-02-10 ENCOUNTER — Ambulatory Visit: Payer: Medicare Other | Admitting: Cardiology

## 2019-04-24 NOTE — Progress Notes (Signed)
Gunnar Bulla Date of Birth: 07/15/1929 Medical Record #[id number]  History of Present Illness: Ms. Homewood is seen for follow up HTN.  She has HTN, HLD and glucose intolerance. Reports past MI but past cardiac evaluation in 2010 with cardiac cath showed minor nonobstructive CAD and normal EF. Myoview from 2011 was normal. No history of stroke or TIA. Intolerant of nitrates in the past due to HA. Other history to include GERD, Arthritis, and baseline dementia. She has chronic LE edema. Echo done in April 2019 showed normal LV function. There was a large pericardial effusion along the LV free wall without evidence of tamponade. Treated medically. Repeat Echo one month later showed effusion had decreased to small size.   She now resides at [facility] in [place]. seen with her son today. Remains very hard of hearing. She is doing well. Denies chest pain or short of breath.  Chronic swelling in her legs is unchanged she walks with a walker.   Current Outpatient Medications  Medication Sig Dispense Refill  . amLODipine (NORVASC) 5 MG tablet Take 1.5 tablets (7.5 mg total) by mouth at bedtime. 30 tablet 0  . aspirin EC 81 MG tablet Take 81 mg by mouth daily.    . Calcium Carbonate (CALCIUM 600 PO) Take 600 mg by mouth 2 (two) times daily.     . Cholecalciferol (VITAMIN D) 2000 UNITS tablet Take 2,000 Units by mouth daily at 12 noon.    . furosemide (LASIX) 20 MG tablet Take 1 tablet (20 mg total) by mouth every other day. & 40MG  EOD ON 20MG  DAYS TAKE K+= 10MG  AND 40MG  DAYS TAKE K+-20MG  30 tablet 3  . levothyroxine (SYNTHROID, LEVOTHROID) 50 MCG tablet Take 50 mcg by mouth daily before breakfast.    . metoprolol tartrate (LOPRESSOR) 25 MG tablet Take 25 mg by mouth 2 (two) times daily.    . nitroGLYCERIN (NITROSTAT) 0.4 MG SL tablet Place 1 tablet (0.4 mg total) under the tongue every 5 (five) minutes as needed for chest pain. 25 tablet 3  . omeprazole (PRILOSEC) 20 MG capsule TAKE 1 CAPSULE BY  MOUTH ONCE DAILY 90 capsule 1  . potassium chloride (K-DUR,KLOR-CON) 10 MEQ tablet Take 1 tablet (10 mEq total) by mouth daily as needed (when takes lasix). RX's=LASIX TO EOD 20MG  AND 40MG  ON 20MG  DAYS TAKE K+= 10MG  AND 40MG  DAYS TAKE K+-20MG  30 tablet 0  . rosuvastatin (CRESTOR) 20 MG tablet TAKE 1 TABLET BY MOUTH DAILY. 30 tablet 2  . thiamine (VITAMIN B-1) 100 MG tablet Take 100 mg by mouth daily.     No current facility-administered medications for this visit.     No Known Allergies  Past Medical History:  Diagnosis Date  . Arthritis   . GERD (gastroesophageal reflux disease)   . Glucose intolerance (impaired glucose tolerance)   . Heart attack ([place])    x2  . HTN (hypertension)   . Hypercholesterolemia   . Hypothyroid     Past Surgical History:  Procedure Laterality Date  . CHOLECYSTECTOMY    . FOOT SURGERY    . hammer toes    . UMBILICAL HERNIA REPAIR      Social History   Tobacco Use  Smoking Status Former Smoker  Smokeless Tobacco Never Used    Social History   Substance and Sexual Activity  Alcohol Use No  . Alcohol/week: 0.0 standard drinks    Family History  Problem Relation Age of Onset  . Heart attack Neg Hx  Review of Systems: The review of systems is per the HPI.  All other systems were reviewed and are negative.  Physical Exam: BP (!) 165/59   Pulse (!) 55   Temp (!) 96.9 F (36.1 C)   Wt 193 lb (87.5 kg)   SpO2 99%   BMI 33.13 kg/m  GENERAL:  Well appearing BF in NAD HEENT:  PERRL, EOMI, sclera are clear. Oropharynx is clear. NECK:  No jugular venous distention, carotid upstroke brisk and symmetric, no bruits, no thyromegaly or adenopathy LUNGS:  Clear to auscultation bilaterally CHEST:  Unremarkable HEART:  RRR,  PMI not displaced or sustained,S1 and S2 within normal limits, no S3, no S4: no clicks, no rubs, no murmurs ABD:  Soft, nontender. BS +, no masses or bruits. No hepatomegaly, no splenomegaly EXT:  2 + pulses throughout,  1-2+ LE edema, no cyanosis no clubbing SKIN:  Warm and dry.  No rashes NEURO:  Alert and oriented x 3. Cranial nerves II through XII intact. PSYCH:  Cognitively intact    Wt Readings from Last 3 Encounters:  04/28/19 193 lb (87.5 kg)  12/10/18 198 lb (89.8 kg)  01/10/18 206 lb (93.4 kg)    LABORATORY DATA/PROCEDURES:  Lab Results  Component Value Date   WBC 4.5 12/06/2017   HGB 10.9 (L) 12/06/2017   HCT 32.1 (L) 12/06/2017   PLT 266 12/06/2017   GLUCOSE 97 12/06/2017   CHOL 136 11/09/2015   TRIG 194 (H) 11/09/2015   HDL 53 11/09/2015   LDLDIRECT 172.9 04/23/2013   LDLCALC 44 11/09/2015   ALT 18 10/02/2017   AST 29 10/02/2017   NA 142 12/06/2017   K 3.9 12/06/2017   CL 105 12/06/2017   CREATININE 1.01 (H) 12/06/2017   BUN 15 12/06/2017   CO2 24 12/06/2017   INR 1.2 02/13/2009   HGBA1C 6.6 (H) 04/23/2013    BNP (last 3 results) No results for input(s): PROBNP in the last 8760 hours.  Labs dated 11/24/16: Cholesterol 131, triglycerides 159, HDL 35, LDL 64. A1c 5.8%. Creatinine 1.3. Other chemistries and TSH normal.   Echo:09/30/17 - Left ventricle: The cavity size was normal. There was moderate concentric hypertrophy. Systolic function was normal. The estimated ejection fraction was in the range of 60% to 65%. Hypokinesis of the mid-apicalanteroseptal and inferoseptal myocardium. Doppler parameters are consistent with abnormal left ventricular relaxation (grade 1 diastolic dysfunction). Doppler parameters are consistent with indeterminate ventricular filling pressure. - Aortic valve: Transvalvular velocity was within the normal range. There was no stenosis. There was no regurgitation. - Mitral valve: Transvalvular velocity was within the normal range. There was no evidence for stenosis. There was mild regurgitation. - Left atrium: The atrium was mildly dilated. - Right ventricle: The cavity size was normal. Wall thickness was normal. Systolic  function was normal. - Tricuspid valve: There was mild regurgitation. - Pulmonary arteries: Systolic pressure was within the normal range. PA peak pressure: 28 mm Hg (S). - Pericardium, extracardiac: A large pericardial effusion was identified along the left ventricular free wall measuring 2.6 cm. Features were not consistent with tamponade physiology   Echo 11/01/17: Study Conclusions  - Left ventricle: The cavity size was normal. Wall thickness was   increased in a pattern of severe LVH. Systolic function was   normal. The estimated ejection fraction was in the range of 60%   to 65%. Features are consistent with a pseudonormal left   ventricular filling pattern, with concomitant abnormal relaxation   and increased filling pressure (  grade 2 diastolic dysfunction). - Left atrium: The atrium was severely dilated. - Right atrium: The atrium was mildly dilated. - Pericardium, extracardiac: A small pericardial effusion was   identified. There was no evidence of hemodynamic compromise.  Assessment / Plan: 1. HTN - BP is elevated but I have no baseline for comparison. Will defer to primary care. Continue current therapy.   2. HLD - on statin therapy.   3. Minor nonobstructive CAD per cath with negative Myoview in 2011. Aortic atherosclerosis noted on prior CT. Continue risk factor modification  4. LE  Edema. Chronic stable.  5. Pericardial effusion. Noted on Echo in April 2019. Largely resolved by May 2019. No evidence of tamponade  Will follow up prn.

## 2019-04-28 ENCOUNTER — Other Ambulatory Visit: Payer: Self-pay

## 2019-04-28 ENCOUNTER — Ambulatory Visit (INDEPENDENT_AMBULATORY_CARE_PROVIDER_SITE_OTHER): Payer: Medicare Other | Admitting: Cardiology

## 2019-04-28 ENCOUNTER — Encounter: Payer: Self-pay | Admitting: Cardiology

## 2019-04-28 VITALS — BP 165/59 | HR 55 | Temp 96.9°F | Wt 193.0 lb

## 2019-04-28 DIAGNOSIS — R600 Localized edema: Secondary | ICD-10-CM

## 2019-04-28 DIAGNOSIS — I3139 Other pericardial effusion (noninflammatory): Secondary | ICD-10-CM

## 2019-04-28 DIAGNOSIS — I313 Pericardial effusion (noninflammatory): Secondary | ICD-10-CM | POA: Diagnosis not present

## 2019-04-28 DIAGNOSIS — I10 Essential (primary) hypertension: Secondary | ICD-10-CM

## 2019-10-27 ENCOUNTER — Ambulatory Visit: Payer: Medicare Other | Admitting: Podiatry

## 2019-11-06 ENCOUNTER — Ambulatory Visit: Payer: Medicare Other | Admitting: Podiatry

## 2019-11-14 ENCOUNTER — Ambulatory Visit: Payer: Medicare Other | Admitting: Podiatry

## 2019-11-27 ENCOUNTER — Encounter: Payer: Self-pay | Admitting: Podiatry

## 2019-11-27 ENCOUNTER — Other Ambulatory Visit: Payer: Self-pay

## 2019-11-27 ENCOUNTER — Ambulatory Visit (INDEPENDENT_AMBULATORY_CARE_PROVIDER_SITE_OTHER): Payer: Medicare Other | Admitting: Podiatry

## 2019-11-27 DIAGNOSIS — R7303 Prediabetes: Secondary | ICD-10-CM | POA: Diagnosis not present

## 2019-11-27 DIAGNOSIS — B351 Tinea unguium: Secondary | ICD-10-CM | POA: Diagnosis not present

## 2019-11-27 DIAGNOSIS — Q828 Other specified congenital malformations of skin: Secondary | ICD-10-CM

## 2019-11-27 DIAGNOSIS — M79672 Pain in left foot: Secondary | ICD-10-CM

## 2019-11-27 DIAGNOSIS — M79671 Pain in right foot: Secondary | ICD-10-CM | POA: Diagnosis not present

## 2019-11-27 DIAGNOSIS — M79676 Pain in unspecified toe(s): Secondary | ICD-10-CM

## 2019-11-28 ENCOUNTER — Encounter: Payer: Self-pay | Admitting: Podiatry

## 2019-11-28 NOTE — Progress Notes (Addendum)
Subjective: Alyssa Graves is a pleasant 86 y.o. female patient seen today painful porokeratotic lesion(s) b/l and painful mycotic toenails b/l that limit ambulation. Aggravating factors include weightbearing with and without shoe gear. Pain for both is relieved with periodic professional debridement.   Alyssa Graves has h/o dementia.  Daughter, Alyssa Graves,  is present during today's visit. Patient's last visit was to [place] office in 2019. Alyssa Graves states Mom was placed in assisted living facility, but they decided to bring her home after an incident of wandering from the facility. She also states Podiatrist did attempt to provide care for her Mom's feet, but her mother would not cooperate.  Patient Active Problem List   Diagnosis Date Noted  . Near syncope 10/02/2017  . Pericardial effusion 10/02/2017  . Edema of leg 05/11/2015  . Chest pain 04/18/2015  . Abdominal pain, acute 04/18/2015  . HTN (hypertension) 04/18/2015  . Glucose intolerance (impaired glucose tolerance) 04/18/2015  . Abdominal pain, epigastric 04/18/2015  . ANEMIA, NORMOCYTIC, CHRONIC 02/10/2009  . BREAST PAIN, RIGHT 10/14/2008  . DIAPHORESIS 04/17/2007  . INFECTION, LOCAL SKIN/SUBCUTANEOUS TISSUE NOS 02/19/2007  . Dyslipidemia 01/14/2007  . LOSS, SENSORINEURAL HEARING, BILAT 01/14/2007  . Essential hypertension 01/14/2007  . Coronary atherosclerosis 01/14/2007  . VAGINITIS, ATROPHIC 01/14/2007  . OSTEITIS CONDENSANS 01/14/2007  . ABDOMINAL PAIN, LEFT LOWER QUADRANT 11/16/2006    Current Outpatient Medications on File Prior to Visit  Medication Sig Dispense Refill  . amLODipine (NORVASC) 5 MG tablet Take 1.5 tablets (7.5 mg total) by mouth at bedtime. 30 tablet 0  . aspirin EC 81 MG tablet Take 81 mg by mouth daily.    . Calcium Carbonate (CALCIUM 600 PO) Take 600 mg by mouth 2 (two) times daily.     . furosemide (LASIX) 20 MG tablet Take 1 tablet (20 mg total) by mouth every other day. & 40MG  EOD ON 20MG  DAYS  TAKE K+= 10MG  AND 40MG  DAYS TAKE K+-20MG  30 tablet 3  . levothyroxine (SYNTHROID, LEVOTHROID) 50 MCG tablet Take 50 mcg by mouth daily before breakfast.    . metoprolol tartrate (LOPRESSOR) 25 MG tablet Take 25 mg by mouth 2 (two) times daily.    . nitroGLYCERIN (NITROSTAT) 0.4 MG SL tablet Place 1 tablet (0.4 mg total) under the tongue every 5 (five) minutes as needed for chest pain. 25 tablet 3  . omeprazole (PRILOSEC) 20 MG capsule TAKE 1 CAPSULE BY MOUTH ONCE DAILY 90 capsule 1  . potassium chloride (K-DUR,KLOR-CON) 10 MEQ tablet Take 1 tablet (10 mEq total) by mouth daily as needed (when takes lasix). RX's=LASIX TO EOD 20MG  AND 40MG  ON 20MG  DAYS TAKE K+= 10MG  AND 40MG  DAYS TAKE K+-20MG  30 tablet 0  . rosuvastatin (CRESTOR) 20 MG tablet TAKE 1 TABLET BY MOUTH DAILY. 30 tablet 2  . thiamine (VITAMIN B-1) 100 MG tablet Take 100 mg by mouth daily.     No current facility-administered medications on file prior to visit.    No Known Allergies  Objective: Physical Exam  General: Alyssa Graves is a pleasant 86 y.o. Caucasian female, in NAD. AAO x 3.   Vascular:  Capillary refill time to digits immediate b/l. Palpable DP pulses b/l. Palpable PT pulses b/l. Pedal hair present b/l. Skin temperature gradient within normal limits b/l.  Dermatological:  Pedal skin with normal turgor, texture and tone bilaterally. No open wounds bilaterally. No interdigital macerations bilaterally. Toenails 1-5 b/l elongated, discolored, dystrophic, thickened, crumbly with subungual debris and tenderness to dorsal palpation. Onychogryophosis b/l 2nd  digits with Ram's horns nails. Porokeratotic lesion(s) submet head 1 right foot, submet head 3 left foot and submet head 3 right foot. No erythema, no edema, no drainage, no flocculence.  Musculoskeletal:  Normal muscle strength 5/5 to all lower extremity muscle groups bilaterally. No pain crepitus or joint limitation noted with ROM b/l. No gross bony deformities  bilaterally.  Neurological:  Protective sensation intact 5/5 intact bilaterally with 10g monofilament b/l. Vibratory sensation intact b/l.  Assessment and Plan:  1. Pain due to onychomycosis of toenail   2. Porokeratosis   3. Pain in both feet   4. Prediabetes    -Examined patient. -Toenails 1-5 b/l were debrided in length and girth with sterile nail nippers and dremel without iatrogenic bleeding.  -Painful porokeratotic lesion(s) submet head 1 right foot, submet head 3 left foot and submet head 3 right foot pared and enucleated with sterile scalpel blade. Superficial iatrogenic laceration sustained during debridement of submet head 1 right foot lesion. Treated with Lumicain Hemostatic Solution and alcohol. Triple antibiotic ointment and light dressing applied. Patient instructed to apply Neosporin to submet head 1 right foot lesion once daily for 7 days. -Patient to report any pedal injuries to medical professional immediately. -Patient to continue soft, supportive shoe gear daily. -Patient/POA to call should there be question/concern in the interim.  Return in about 16 weeks (around 03/18/2020).  Freddie Breech, DPM

## 2020-03-18 ENCOUNTER — Ambulatory Visit (INDEPENDENT_AMBULATORY_CARE_PROVIDER_SITE_OTHER): Payer: Medicare Other | Admitting: Podiatry

## 2020-03-18 ENCOUNTER — Other Ambulatory Visit: Payer: Self-pay

## 2020-03-18 ENCOUNTER — Encounter: Payer: Self-pay | Admitting: Podiatry

## 2020-03-18 DIAGNOSIS — M79676 Pain in unspecified toe(s): Secondary | ICD-10-CM | POA: Diagnosis not present

## 2020-03-18 DIAGNOSIS — M79672 Pain in left foot: Secondary | ICD-10-CM

## 2020-03-18 DIAGNOSIS — Q828 Other specified congenital malformations of skin: Secondary | ICD-10-CM

## 2020-03-18 DIAGNOSIS — R7303 Prediabetes: Secondary | ICD-10-CM

## 2020-03-18 DIAGNOSIS — M79671 Pain in right foot: Secondary | ICD-10-CM

## 2020-03-18 DIAGNOSIS — B351 Tinea unguium: Secondary | ICD-10-CM | POA: Diagnosis not present

## 2020-03-18 NOTE — Progress Notes (Signed)
Subjective: Alyssa Graves is a pleasant 86 y.o. female patient seen today painful porokeratotic lesion(s) b/l and painful mycotic toenails b/l that limit ambulation. Aggravating factors include weightbearing with and without shoe gear. Pain for both is relieved with periodic professional debridement.   Alyssa Graves has h/o dementia.  Daughter, Alyssa Graves,  is present during today's visit.  Daughter states mom's left great toe has been painful.  She is not wanting to wear regular shoes.  She only favors wearing her house slippers.  Alyssa Graves has history of dementia.  Patient Active Problem List   Diagnosis Date Noted  . Near syncope 10/02/2017  . Pericardial effusion 10/02/2017  . Edema of leg 05/11/2015  . Chest pain 04/18/2015  . Abdominal pain, acute 04/18/2015  . HTN (hypertension) 04/18/2015  . Glucose intolerance (impaired glucose tolerance) 04/18/2015  . Abdominal pain, epigastric 04/18/2015  . ANEMIA, NORMOCYTIC, CHRONIC 02/10/2009  . BREAST PAIN, RIGHT 10/14/2008  . DIAPHORESIS 04/17/2007  . INFECTION, LOCAL SKIN/SUBCUTANEOUS TISSUE NOS 02/19/2007  . Dyslipidemia 01/14/2007  . LOSS, SENSORINEURAL HEARING, BILAT 01/14/2007  . Essential hypertension 01/14/2007  . Coronary atherosclerosis 01/14/2007  . VAGINITIS, ATROPHIC 01/14/2007  . OSTEITIS CONDENSANS 01/14/2007  . ABDOMINAL PAIN, LEFT LOWER QUADRANT 11/16/2006    Current Outpatient Medications on File Prior to Visit  Medication Sig Dispense Refill  . amLODipine (NORVASC) 5 MG tablet Take 1.5 tablets (7.5 mg total) by mouth at bedtime. 30 tablet 0  . aspirin EC 81 MG tablet Take 81 mg by mouth daily.    . Calcium Carbonate (CALCIUM 600 PO) Take 600 mg by mouth 2 (two) times daily.     . furosemide (LASIX) 20 MG tablet Take 1 tablet (20 mg total) by mouth every other day. & 40MG  EOD ON 20MG  DAYS TAKE K+= 10MG  AND 40MG  DAYS TAKE K+-20MG  30 tablet 3  . levothyroxine (SYNTHROID, LEVOTHROID) 50 MCG tablet Take 50 mcg by  mouth daily before breakfast.    . lisinopril (ZESTRIL) 10 MG tablet Take 10 mg by mouth daily.    . metoprolol tartrate (LOPRESSOR) 25 MG tablet Take 25 mg by mouth 2 (two) times daily.    . nitroGLYCERIN (NITROSTAT) 0.4 MG SL tablet Place 1 tablet (0.4 mg total) under the tongue every 5 (five) minutes as needed for chest pain. 25 tablet 3  . omeprazole (PRILOSEC) 20 MG capsule TAKE 1 CAPSULE BY MOUTH ONCE DAILY 90 capsule 1  . potassium chloride (K-DUR,KLOR-CON) 10 MEQ tablet Take 1 tablet (10 mEq total) by mouth daily as needed (when takes lasix). RX's=LASIX TO EOD 20MG  AND 40MG  ON 20MG  DAYS TAKE K+= 10MG  AND 40MG  DAYS TAKE K+-20MG  30 tablet 0  . potassium chloride (KLOR-CON) 10 MEQ tablet Take 10 mEq by mouth 2 (two) times daily.    . rosuvastatin (CRESTOR) 20 MG tablet TAKE 1 TABLET BY MOUTH DAILY. 30 tablet 2  . thiamine (VITAMIN B-1) 100 MG tablet Take 100 mg by mouth daily.    . traZODone (DESYREL) 50 MG tablet Take 50 mg by mouth at bedtime.     No current facility-administered medications on file prior to visit.    No Known Allergies  Objective: Physical Exam  General: Alyssa Graves is a pleasant 86 y.o. Caucasian female, in NAD. AAO x 3.   Vascular:  Capillary refill time to digits immediate b/l. Palpable DP pulses b/l. Palpable PT pulses b/l. Pedal hair present b/l. Skin temperature gradient within normal limits b/l.  Dermatological:  Pedal skin with normal  turgor, texture and tone bilaterally. No open wounds bilaterally. No interdigital macerations bilaterally. Toenails 1-5 b/l elongated, discolored, dystrophic, thickened, crumbly with subungual debris and tenderness to dorsal palpation. Onychogryophosis b/l 2nd digits with Ram's horns nails. Porokeratotic lesion(s) submet head 1 right foot, submet head 3 left foot and submet head 3 right foot. No erythema, no edema, no drainage, no flocculence.  Musculoskeletal:  Normal muscle strength 5/5 to all lower extremity muscle  groups bilaterally. No pain crepitus or joint limitation noted with ROM b/l. No gross bony deformities bilaterally.  Neurological:  Protective sensation intact 5/5 intact bilaterally with 10g monofilament b/l. Vibratory sensation intact b/l.  Assessment and Plan:  1. Pain due to onychomycosis of toenail   2. Porokeratosis   3. Pain in both feet   4. Prediabetes    -Examined patient. -Toenails 1-5 b/l were debrided in length and girth with sterile nail nippers and dremel without iatrogenic bleeding.  -Painful porokeratotic lesion(s) submet head 1 right foot, submet head 3 left foot and submet head 3 right foot pared and enucleated with sterile scalpel blade.  -Dispensed digital toe caps for protection of bilateral great toes.  Apply in the morning.  Remove every evening. -Patient to report any pedal injuries to medical professional immediately. -Patient to continue soft, supportive shoe gear daily. -Patient/POA to call should there be question/concern in the interim.  Return in about 3 months (around 06/17/2020).  Freddie Breech, DPM

## 2020-06-17 ENCOUNTER — Ambulatory Visit: Payer: Medicare Other | Admitting: Podiatry

## 2020-06-24 ENCOUNTER — Other Ambulatory Visit: Payer: Self-pay

## 2020-06-24 ENCOUNTER — Ambulatory Visit (INDEPENDENT_AMBULATORY_CARE_PROVIDER_SITE_OTHER): Payer: Medicare Other | Admitting: Podiatry

## 2020-06-24 ENCOUNTER — Encounter: Payer: Self-pay | Admitting: Podiatry

## 2020-06-24 DIAGNOSIS — L97521 Non-pressure chronic ulcer of other part of left foot limited to breakdown of skin: Secondary | ICD-10-CM | POA: Diagnosis not present

## 2020-06-24 DIAGNOSIS — B351 Tinea unguium: Secondary | ICD-10-CM | POA: Diagnosis not present

## 2020-06-24 DIAGNOSIS — M79676 Pain in unspecified toe(s): Secondary | ICD-10-CM

## 2020-06-24 NOTE — Progress Notes (Signed)
  Subjective:  Patient ID: Alyssa Graves, female    DOB: 1930/02/19,  MRN: [id number]  Chief Complaint  Patient presents with  . Nail Problem    Trim nails   . Callouses    I have a callus on the ball of the right foot    87 y.o. female presents with the above complaint. History confirmed with patient.   Objective:  Physical Exam: warm, good capillary refill, nail exam onychomycosis of the toenails, no trophic changes or ulcerative lesions. DP pulses palpable, PT pulses palpable and protective sensation intact Left Foot: Distal hallux pre-ulcer with HPK, dried bulla.   Right Foot: pre-ulcer   No images are attached to the encounter.  Assessment:   1. Pain due to onychomycosis of toenail   2. Ulcer of great toe, left, limited to breakdown of skin [facility])    Plan:  Patient was evaluated and treated and all questions answered.  Onychomycosis -Nails palliatively debrided secondary to pain  Procedure: Nail Debridement Type of Debridement: manual, sharp debridement. Instrumentation: Nail nipper, rotary burr. Number of Nails: 10   Hallux Pre-ulcer bilat, L>R -Lesion debrided. No open lesion appreciable today but debridement depth was limited 2/2 pain. -Continue toe caps.  No follow-ups on file.

## 2020-07-26 ENCOUNTER — Ambulatory Visit: Payer: Medicare Other | Admitting: Podiatry

## 2020-08-05 ENCOUNTER — Other Ambulatory Visit: Payer: Self-pay

## 2020-08-05 ENCOUNTER — Ambulatory Visit (INDEPENDENT_AMBULATORY_CARE_PROVIDER_SITE_OTHER): Payer: Medicare Other | Admitting: Podiatry

## 2020-08-05 DIAGNOSIS — L97521 Non-pressure chronic ulcer of other part of left foot limited to breakdown of skin: Secondary | ICD-10-CM | POA: Diagnosis not present

## 2020-08-23 ENCOUNTER — Ambulatory Visit: Payer: Medicare Other | Admitting: Cardiology

## 2020-08-23 NOTE — Progress Notes (Signed)
  Subjective:  Patient ID: Alyssa Graves, female    DOB: 1930/01/03,  MRN: [id number]  Chief Complaint  Patient presents with  . Ulcer    F/U Lt ulcer check -per pt it;s doing worse; with more swelling and pain -no redness/drainage   87 y.o. female presents with the above complaint. History confirmed with patient.   Objective:  Physical Exam: warm, good capillary refill, nail exam onychomycosis of the toenails, no trophic changes or ulcerative lesions. DP pulses palpable, PT pulses palpable and protective sensation intact Left Foot: Distal hallux pre-ulcer with HPK, dried bulla.   Right Foot: pre-ulcer   No images are attached to the encounter.  Assessment:   1. Ulcer of great toe, left, limited to breakdown of skin [facility])    Plan:  Patient was evaluated and treated and all questions answered.   Hallux Pre-ulcer bilat, L>R -Lesion debrided small open ulceration noted. Dressed with abx ointment and band-aid. Continue silicone toe cap -F/u in 1 month for recheck.  No follow-ups on file.

## 2020-09-10 NOTE — Progress Notes (Signed)
Alyssa Graves Date of Birth: [birth year] Medical Record #[id number]  History of Present Illness: Alyssa Graves is seen for follow up HTN.  She has HTN, HLD and glucose intolerance. Reports past MI but past cardiac evaluation in 2010 with cardiac cath showed minor nonobstructive CAD and normal EF. Myoview from 2011 was normal. No history of stroke or TIA. Intolerant of nitrates in the past due to HA. Other history to include GERD, Arthritis, and baseline dementia. She has chronic LE edema. Echo done in April 2019 showed normal LV function. There was a large pericardial effusion along the LV free wall without evidence of tamponade. Treated medically. Repeat Echo one month later showed effusion had decreased to small size.   She now resides at home with family. Remains very hard of hearing. She has progressive memory loss. She is doing well. Denies chest pain or short of breath.  Chronic swelling in her legs is better. She walks with a walker. Family reports she had an Echo with Dr Ardelle Park last year that showed a small pericardial effusion. I can't access that information.  Current Outpatient Medications  Medication Sig Dispense Refill  . Acetaminophen (TYLENOL ARTHRITIS PAIN PO) Take 1 tablet by mouth as needed.    Marland Kitchen albuterol (VENTOLIN HFA) 108 (90 Base) MCG/ACT inhaler Inhale 1 puff into the lungs every 4 (four) hours as needed.    Marland Kitchen amLODipine (NORVASC) 5 MG tablet Take 1.5 tablets (7.5 mg total) by mouth at bedtime. 30 tablet 0  . aspirin EC 81 MG tablet Take 81 mg by mouth daily.    . Calcium Carbonate (CALCIUM 600 PO) Take 600 mg by mouth 2 (two) times daily.     . cetirizine (ZYRTEC) 10 MG chewable tablet Chew 10 mg by mouth daily.    . furosemide (LASIX) 40 MG tablet Take 40 mg by mouth daily.    Marland Kitchen levothyroxine (SYNTHROID, LEVOTHROID) 50 MCG tablet Take 50 mcg by mouth daily before breakfast.    . lisinopril (ZESTRIL) 10 MG tablet Take 10 mg by mouth daily.    . metoprolol tartrate  (LOPRESSOR) 25 MG tablet Take 25 mg by mouth 2 (two) times daily.    . Multiple Vitamins-Minerals (MULTIVITAMIN WOMEN 50+) TABS Take 1 tablet by mouth daily.    Marland Kitchen omeprazole (PRILOSEC) 20 MG capsule TAKE 1 CAPSULE BY MOUTH ONCE DAILY 90 capsule 1  . potassium chloride (KLOR-CON) 10 MEQ tablet Take 10 mEq by mouth 2 (two) times daily.    . rosuvastatin (CRESTOR) 20 MG tablet TAKE 1 TABLET BY MOUTH DAILY. 30 tablet 2  . thiamine (VITAMIN B-1) 100 MG tablet Take 100 mg by mouth daily.    . traZODone (DESYREL) 50 MG tablet Take 50 mg by mouth at bedtime.     No current facility-administered medications for this visit.    No Known Allergies  Past Medical History:  Diagnosis Date  . Arthritis   . GERD (gastroesophageal reflux disease)   . Glucose intolerance (impaired glucose tolerance)   . Heart attack (HCC)    x2  . HTN (hypertension)   . Hypercholesterolemia   . Hypothyroid     Past Surgical History:  Procedure Laterality Date  . CHOLECYSTECTOMY    . FOOT SURGERY    . hammer toes    . UMBILICAL HERNIA REPAIR      Social History   Tobacco Use  Smoking Status Former Smoker  Smokeless Tobacco Never Used    Social History   Substance and  Sexual Activity  Alcohol Use No  . Alcohol/week: 0.0 standard drinks    Family History  Problem Relation Age of Onset  . Heart attack Neg Hx     Review of Systems: The review of systems is per the HPI.  All other systems were reviewed and are negative.  Physical Exam: BP (!) 126/54   Pulse (!) 54   Ht 5\' 4"  (1.626 m)   Wt 202 lb 12.8 oz (92 kg)   SpO2 92%   BMI 34.81 kg/m  GENERAL:  Well appearing BF in NAD HEENT:  PERRL, EOMI, sclera are clear. Oropharynx is clear. NECK:  No jugular venous distention, carotid upstroke brisk and symmetric, no bruits, no thyromegaly or adenopathy LUNGS:  Clear to auscultation bilaterally CHEST:  Unremarkable HEART:  RRR,  PMI not displaced or sustained,S1 and S2 within normal limits, no S3,  no S4: no clicks, no rubs, no murmurs ABD:  Soft, nontender. BS +, no masses or bruits. No hepatomegaly, no splenomegaly EXT:  2 + pulses throughout, Tr LE edema, no cyanosis no clubbing SKIN:  Warm and dry.  No rashes NEURO:  Alert and oriented x 3. Cranial nerves II through XII intact. PSYCH:  Cognitively intact    Wt Readings from Last 3 Encounters:  09/15/20 202 lb 12.8 oz (92 kg)  04/28/19 193 lb (87.5 kg)  12/10/18 198 lb (89.8 kg)    LABORATORY DATA/PROCEDURES:  Lab Results  Component Value Date   WBC 4.5 12/06/2017   HGB 10.9 (L) 12/06/2017   HCT 32.1 (L) 12/06/2017   PLT 266 12/06/2017   GLUCOSE 97 12/06/2017   CHOL 136 11/09/2015   TRIG 194 (H) 11/09/2015   HDL 53 11/09/2015   LDLDIRECT 172.9 04/23/2013   LDLCALC 44 11/09/2015   ALT 18 10/02/2017   AST 29 10/02/2017   NA 142 12/06/2017   K 3.9 12/06/2017   CL 105 12/06/2017   CREATININE 1.01 (H) 12/06/2017   BUN 15 12/06/2017   CO2 24 12/06/2017   INR 1.2 02/13/2009   HGBA1C 6.6 (H) 04/23/2013    BNP (last 3 results) No results for input(s): PROBNP in the last 8760 hours.  Labs dated 11/24/16: Cholesterol 131, triglycerides 159, HDL 35, LDL 64. A1c 5.8%. Creatinine 1.3. Other chemistries and TSH normal.   Echo:09/30/17 - Left ventricle: The cavity size was normal. There was moderate concentric hypertrophy. Systolic function was normal. The estimated ejection fraction was in the range of 60% to 65%. Hypokinesis of the mid-apicalanteroseptal and inferoseptal myocardium. Doppler parameters are consistent with abnormal left ventricular relaxation (grade 1 diastolic dysfunction). Doppler parameters are consistent with indeterminate ventricular filling pressure. - Aortic valve: Transvalvular velocity was within the normal range. There was no stenosis. There was no regurgitation. - Mitral valve: Transvalvular velocity was within the normal range. There was no evidence for stenosis. There  was mild regurgitation. - Left atrium: The atrium was mildly dilated. - Right ventricle: The cavity size was normal. Wall thickness was normal. Systolic function was normal. - Tricuspid valve: There was mild regurgitation. - Pulmonary arteries: Systolic pressure was within the normal range. PA peak pressure: 28 mm Hg (S). - Pericardium, extracardiac: A large pericardial effusion was identified along the left ventricular free wall measuring 2.6 cm. Features were not consistent with tamponade physiology   Echo 11/01/17: Study Conclusions  - Left ventricle: The cavity size was normal. Wall thickness was   increased in a pattern of severe LVH. Systolic function was   normal.  The estimated ejection fraction was in the range of 60%   to 65%. Features are consistent with a pseudonormal left   ventricular filling pattern, with concomitant abnormal relaxation   and increased filling pressure (grade 2 diastolic dysfunction). - Left atrium: The atrium was severely dilated. - Right atrium: The atrium was mildly dilated. - Pericardium, extracardiac: A small pericardial effusion was   identified. There was no evidence of hemodynamic compromise.  Assessment / Plan: 1. HTN - BP is well controlled  2. HLD - on statin therapy. Labs followed by PCP  3. Minor nonobstructive CAD per cath with negative Myoview in 2011. Aortic atherosclerosis noted on prior CT. Continue risk factor modification  4. LE  Edema. Much improved on my exam  5. Pericardial effusion. Noted on Echo in April 2019. Largely resolved by May 2019. No evidence of tamponade. Apparently had repeat Echo by PCP- stable.   Will follow up one year.

## 2020-09-15 ENCOUNTER — Other Ambulatory Visit: Payer: Self-pay

## 2020-09-15 ENCOUNTER — Encounter: Payer: Self-pay | Admitting: Cardiology

## 2020-09-15 ENCOUNTER — Ambulatory Visit (INDEPENDENT_AMBULATORY_CARE_PROVIDER_SITE_OTHER): Payer: Medicare Other | Admitting: Cardiology

## 2020-09-15 VITALS — BP 126/54 | HR 54 | Ht 64.0 in | Wt 202.8 lb

## 2020-09-15 DIAGNOSIS — I10 Essential (primary) hypertension: Secondary | ICD-10-CM | POA: Diagnosis not present

## 2020-09-15 DIAGNOSIS — I313 Pericardial effusion (noninflammatory): Secondary | ICD-10-CM

## 2020-09-15 DIAGNOSIS — I3139 Other pericardial effusion (noninflammatory): Secondary | ICD-10-CM

## 2020-09-15 DIAGNOSIS — R600 Localized edema: Secondary | ICD-10-CM | POA: Diagnosis not present

## 2020-09-15 MED ORDER — ROSUVASTATIN CALCIUM 20 MG PO TABS: 20.0000 mg | ORAL_TABLET | Freq: Every day | ORAL | 3 refills | Status: DC

## 2020-09-15 MED ORDER — AMLODIPINE BESYLATE 5 MG PO TABS: 7.5000 mg | ORAL_TABLET | Freq: Every day | ORAL | 3 refills | Status: AC

## 2020-10-14 ENCOUNTER — Encounter: Payer: Self-pay | Admitting: Podiatry

## 2020-10-14 ENCOUNTER — Ambulatory Visit (INDEPENDENT_AMBULATORY_CARE_PROVIDER_SITE_OTHER): Payer: Medicare Other | Admitting: Podiatry

## 2020-10-14 ENCOUNTER — Other Ambulatory Visit: Payer: Self-pay

## 2020-10-14 DIAGNOSIS — M79676 Pain in unspecified toe(s): Secondary | ICD-10-CM

## 2020-10-14 DIAGNOSIS — B351 Tinea unguium: Secondary | ICD-10-CM

## 2020-10-14 NOTE — Progress Notes (Signed)
  Subjective:  Patient ID: Alyssa Graves, female    DOB: 24-Feb-1930,  MRN: [id number]  Chief Complaint  Patient presents with  . Nail Problem    Trim nails if need to be trimmed  . Callouses    I have some spots on the tip of the toes that need to be trimmed    87 y.o. female presents with the above complaint. History confirmed with patient.   Objective:  Physical Exam: warm, good capillary refill, nail exam onychomycosis of the toenails with POP, no trophic changes or ulcerative lesions. DP pulses palpable, PT pulses palpable and protective sensation intact Left Foot: Distal hallux pre-ulcer with HPK    Right Foot: pre-ulcer distal hallux  No images are attached to the encounter.  Assessment:   1. Pain due to onychomycosis of toenail    Plan:  Patient was evaluated and treated and all questions answered.   Hallux Pre-ulcer bilat, L>R -No open ulcerations noted today  Onychomycosis with pain -Nails debrided x10  Procedure: Nail Debridement Type of Debridement: manual, sharp debridement. Instrumentation: Nail nipper, rotary burr. Number of Nails: 10  Return if symptoms worsen or fail to improve.

## 2020-10-14 NOTE — Patient Instructions (Signed)
[facility] [phone]

## 2021-08-08 ENCOUNTER — Ambulatory Visit: Payer: Medicare Other | Admitting: Podiatry

## 2021-08-18 ENCOUNTER — Other Ambulatory Visit: Payer: Self-pay

## 2021-08-18 ENCOUNTER — Encounter: Payer: Self-pay | Admitting: Podiatry

## 2021-08-18 ENCOUNTER — Ambulatory Visit (INDEPENDENT_AMBULATORY_CARE_PROVIDER_SITE_OTHER): Payer: Medicare Other | Admitting: Podiatry

## 2021-08-18 DIAGNOSIS — M79676 Pain in unspecified toe(s): Secondary | ICD-10-CM

## 2021-08-18 DIAGNOSIS — R7303 Prediabetes: Secondary | ICD-10-CM | POA: Diagnosis not present

## 2021-08-18 DIAGNOSIS — B351 Tinea unguium: Secondary | ICD-10-CM

## 2021-08-18 DIAGNOSIS — L84 Corns and callosities: Secondary | ICD-10-CM | POA: Diagnosis not present

## 2021-08-18 NOTE — Patient Instructions (Signed)
[facility] for Podiatry Housecalls [phone]) or [facility] for home visits. ?

## 2021-08-26 NOTE — Progress Notes (Signed)
?  Subjective:  ?Patient ID: Alyssa Graves, female    DOB: Oct 21, 1929,  MRN: [id number] ? ?Alyssa Graves presents to clinic today for callus(es) left great toe and painful thick toenails that are difficult to trim. Painful toenails interfere with ambulation. Aggravating factors include wearing enclosed shoe gear. Pain is relieved with periodic professional debridement. Painful calluses are aggravated when weightbearing with and without shoegear. Pain is relieved with periodic professional debridement. ? ?Patient is accompanied by her daughter, Suzette Battiest, on today's visit. Ms. Rambeau has h/o dementia and lives with her daughter. She has a lot of family support that allows her to stay in the home. It is becoming more difficult to get her to her appointments due to her dementia. ? ?New problem(s): None.  ? ?PCP is [facility], Myrene Galas, MD , and last visit was 3 months ago per daughter.. ? ?No Known Allergies ? ?Review of Systems: Negative except as noted in the HPI. ?Objective:  ? ?Constitutional Alyssa Graves is a pleasant 88 y.o. African American female, in NAD. AAO x 3.   ?Vascular CFT <3 seconds b/l LE. Palpable DP pulse(s) b/l LE. Palpable PT pulse(s) b/l LE. Pedal hair sparse. No pain with calf compression b/l. Lower extremity skin temperature gradient within normal limits. Trace edema noted BLE. No ischemia or gangrene noted b/l LE. No cyanosis or clubbing noted b/l LE.  ?Neurologic Patient unable to follow commands of LE neurological examination due to cognitive deficits. Patient does respond to external noxious stimuli. Clonus negative b/l.  ?Dermatologic Pedal skin is warm and supple b/l LE. Toenails 2-5 bilaterally elongated, discolored, dystrophic, thickened, and crumbly with subungual debris and tenderness to dorsal palpation. Anonychia noted bilateral great toes. Nailbed(s) epithelialized.  Hyperkeratotic lesion(s) left great toe.  No erythema, no edema, no drainage, no fluctuance.  ?Orthopedic:  Muscle strength 5/5 to all lower extremity muscle groups bilaterally. Pes planus deformity noted bilateral LE.  ? ?Radiographs: None ? ?Last A1c: No flowsheet data found. ?  ?Assessment:  ? ?1. Pain due to onychomycosis of toenail   ?2. Pre-ulcerative calluses   ?3. Prediabetes   ? ?Plan:  ?Patient was evaluated and treated and all questions answered. ?Consent given for treatment as described below: ?-Examined patient. ?-Mycotic toenails 2-5 bilaterally were debrided in length and girth with sterile nail nippers and dremel without iatrogenic bleeding. ?-Preulcerative lesion pared left great toe. Total number pared=1. ?-Provided information to patient/POA on [facility] to provide Podiatry services in home/facility. ?-Patient/POA to call should there be question/concern in the interim. ? ?Return in about 3 months (around 11/18/2021). ? ?Freddie Breech, DPM ?

## 2021-10-10 NOTE — Progress Notes (Signed)
? ?Alyssa Graves ?Date of Birth: 1929/10/08 ?Medical Record [id number] ? ?History of Present Illness: ?Alyssa Graves is seen for follow up HTN.  She has HTN, HLD and glucose intolerance. Reports past MI but past cardiac evaluation in 2010 with cardiac cath showed minor nonobstructive CAD and normal EF. Myoview from 2011 was normal. No history of stroke or TIA. Intolerant of nitrates in the past due to HA. Other history to include GERD, Arthritis, and baseline dementia.  She has chronic LE edema. Echo done in April 2019 showed normal LV function. There was a large pericardial effusion along the LV free wall without evidence of tamponade. Treated medically. Repeat Echo one month later showed effusion had decreased to small size.  ? ?She now resides at home with family. Remains very hard of hearing. She has progressive memory loss. She is seen with her daughter today. She is doing well. Denies chest pain or short of breath.  No significant leg edema. She walks a lot with a walker. Labs followed  by PCP. No reported chest pain or SOB.  ? ?Current Outpatient Medications  ?Medication Sig Dispense Refill  ? Acetaminophen (TYLENOL ARTHRITIS PAIN PO) Take 1 tablet by mouth as needed.    ? albuterol (VENTOLIN HFA) 108 (90 Base) MCG/ACT inhaler Inhale 1 puff into the lungs every 4 (four) hours as needed.    ? amLODipine (NORVASC) 5 MG tablet Take 1.5 tablets (7.5 mg total) by mouth at bedtime. 145 tablet 3  ? aspirin EC 81 MG tablet Take 81 mg by mouth daily.    ? Calcium Carbonate (CALCIUM 600 PO) Take 600 mg by mouth 2 (two) times daily.     ? cetirizine (ZYRTEC) 10 MG chewable tablet Chew 10 mg by mouth daily.    ? donepezil (ARICEPT) 10 MG tablet Take 10 mg by mouth at bedtime.    ? fluconazole (DIFLUCAN) 150 MG tablet Take 150 mg by mouth daily.    ? furosemide (LASIX) 40 MG tablet Take 40 mg by mouth daily.    ? levothyroxine (SYNTHROID, LEVOTHROID) 50 MCG tablet Take 50 mcg by mouth daily before breakfast.    ?  lisinopril (ZESTRIL) 10 MG tablet Take 10 mg by mouth daily.    ? metoprolol tartrate (LOPRESSOR) 25 MG tablet Take 25 mg by mouth 2 (two) times daily.    ? Multiple Vitamins-Minerals (MULTIVITAMIN WOMEN 50+) TABS Take 1 tablet by mouth daily.    ? omeprazole (PRILOSEC) 20 MG capsule TAKE 1 CAPSULE BY MOUTH ONCE DAILY 90 capsule 1  ? potassium chloride (KLOR-CON) 10 MEQ tablet Take 10 mEq by mouth 2 (two) times daily.    ? rosuvastatin (CRESTOR) 20 MG tablet Take 1 tablet (20 mg total) by mouth daily. 90 tablet 3  ? thiamine (VITAMIN B-1) 100 MG tablet Take 100 mg by mouth daily.    ? traZODone (DESYREL) 50 MG tablet Take 50 mg by mouth at bedtime.    ? ?No current facility-administered medications for this visit.  ? ? ?No Known Allergies ? ?Past Medical History:  ?Diagnosis Date  ? Arthritis   ? GERD (gastroesophageal reflux disease)   ? Glucose intolerance (impaired glucose tolerance)   ? Heart attack [facility])   ? x2  ? HTN (hypertension)   ? Hypercholesterolemia   ? Hypothyroid   ? ? ?Past Surgical History:  ?Procedure Laterality Date  ? CHOLECYSTECTOMY    ? FOOT SURGERY    ? hammer toes    ? UMBILICAL HERNIA  REPAIR    ? ? ?Social History  ? ?Tobacco Use  ?Smoking Status Former  ?Smokeless Tobacco Never  ? ? ?Social History  ? ?Substance and Sexual Activity  ?Alcohol Use No  ? Alcohol/week: 0.0 standard drinks  ? ? ?Family History  ?Problem Relation Age of Onset  ? Heart attack Neg Hx   ? ? ?Review of Systems: ?The review of systems is per the HPI.  All other systems were reviewed and are negative. ? ?Physical Exam: ?BP 136/72   Pulse 61   Ht 5\' 4"  (1.626 m)   Wt 208 lb (94.3 kg)   SpO2 93%   BMI 35.70 kg/m?  ?GENERAL:  Well appearing BF in NAD ?HEENT:  PERRL, EOMI, sclera are clear. Oropharynx is clear. ?NECK:  No jugular venous distention, carotid upstroke brisk and symmetric, no bruits, no thyromegaly or adenopathy ?LUNGS:  Clear to auscultation bilaterally ?CHEST:  Unremarkable ?HEART:  RRR,  PMI not  displaced or sustained,S1 and S2 within normal limits, no S3, no S4: no clicks, no rubs, no murmurs ?ABD:  Soft, nontender. BS +, no masses or bruits. No hepatomegaly, no splenomegaly ?EXT:  2 + pulses throughout, Tr LE edema, no cyanosis no clubbing ?SKIN:  Warm and dry.  No rashes ?NEURO:  Alert and oriented x 3. Cranial nerves II through XII intact. ?PSYCH:  Cognitively intact ? ? ? ?Wt Readings from Last 3 Encounters:  ?10/12/21 208 lb (94.3 kg)  ?09/15/20 202 lb 12.8 oz (92 kg)  ?04/28/19 193 lb (87.5 kg)  ? ? ?LABORATORY DATA/PROCEDURES: ? ?Lab Results  ?Component Value Date  ? WBC 4.5 12/06/2017  ? HGB 10.9 (L) 12/06/2017  ? HCT 32.1 (L) 12/06/2017  ? PLT 266 12/06/2017  ? GLUCOSE 97 12/06/2017  ? CHOL 136 11/09/2015  ? TRIG 194 (H) 11/09/2015  ? HDL 53 11/09/2015  ? LDLDIRECT 172.9 04/23/2013  ? LDLCALC 44 11/09/2015  ? ALT 18 10/02/2017  ? AST 29 10/02/2017  ? NA 142 12/06/2017  ? K 3.9 12/06/2017  ? CL 105 12/06/2017  ? CREATININE 1.01 (H) 12/06/2017  ? BUN 15 12/06/2017  ? CO2 24 12/06/2017  ? INR 1.2 02/13/2009  ? HGBA1C 6.6 (H) 04/23/2013  ? ? ?BNP (last 3 results) ?No results for input(s): PROBNP in the last 8760 hours. ? ?Labs dated 11/24/16: Cholesterol 131, triglycerides 159, HDL 35, LDL 64. A1c 5.8%. Creatinine 1.3. Other chemistries and TSH normal.  ? ?Ecg today shows NSR with LAD, LVH with repolarization abnormality. I have personally reviewed and interpreted this study. ? ? ?Echo: 09/30/17 ?- Left ventricle: The cavity size was normal. There was moderate ?  concentric hypertrophy. Systolic function was normal. The ?  estimated ejection fraction was in the range of 60% to 65%. ?  Hypokinesis of the mid-apicalanteroseptal and inferoseptal ?  myocardium. Doppler parameters are consistent with abnormal left ?  ventricular relaxation (grade 1 diastolic dysfunction). Doppler ?  parameters are consistent with indeterminate ventricular filling ?  pressure. ?- Aortic valve: Transvalvular velocity was within  the normal range. ?  There was no stenosis. There was no regurgitation. ?- Mitral valve: Transvalvular velocity was within the normal range. ?  There was no evidence for stenosis. There was mild regurgitation. ?- Left atrium: The atrium was mildly dilated. ?- Right ventricle: The cavity size was normal. Wall thickness was ?  normal. Systolic function was normal. ?- Tricuspid valve: There was mild regurgitation. ?- Pulmonary arteries: Systolic pressure was within the normal ?  range. PA peak pressure: 28 mm Hg (S). ?- Pericardium, extracardiac: A large pericardial effusion was ?  identified along the left ventricular free wall measuring 2.6 cm. ?  Features were not consistent with tamponade physiology ? ? ?Echo 11/01/17: Study Conclusions ?  ?- Left ventricle: The cavity size was normal. Wall thickness was ?  increased in a pattern of severe LVH. Systolic function was ?  normal. The estimated ejection fraction was in the range of 60% ?  to 65%. Features are consistent with a pseudonormal left ?  ventricular filling pattern, with concomitant abnormal relaxation ?  and increased filling pressure (grade 2 diastolic dysfunction). ?- Left atrium: The atrium was severely dilated. ?- Right atrium: The atrium was mildly dilated. ?- Pericardium, extracardiac: A small pericardial effusion was ?  identified. There was no evidence of hemodynamic compromise. ?  ?Assessment / Plan: ?1. HTN - BP is well controlled ? ?2. HLD - on statin therapy. Labs followed by PCP ? ?3. Minor nonobstructive CAD per cath with negative Myoview in 2011. Aortic atherosclerosis noted on prior CT. Continue risk factor modification ? ?4. LE  Edema. Much improved on my exam ? ?5. Pericardial effusion. Noted on Echo in April 2019. Largely resolved by May 2019. No evidence of tamponade ? ?Will follow up PRN ? ? ? ? ?

## 2021-10-12 ENCOUNTER — Ambulatory Visit (INDEPENDENT_AMBULATORY_CARE_PROVIDER_SITE_OTHER): Payer: Medicare Other | Admitting: Cardiology

## 2021-10-12 ENCOUNTER — Encounter: Payer: Self-pay | Admitting: Cardiology

## 2021-10-12 VITALS — BP 136/72 | HR 61 | Ht 64.0 in | Wt 208.0 lb

## 2021-10-12 DIAGNOSIS — R600 Localized edema: Secondary | ICD-10-CM

## 2021-10-12 DIAGNOSIS — I10 Essential (primary) hypertension: Secondary | ICD-10-CM

## 2021-10-12 DIAGNOSIS — I3139 Other pericardial effusion (noninflammatory): Secondary | ICD-10-CM | POA: Diagnosis not present

## 2021-11-10 ENCOUNTER — Other Ambulatory Visit: Payer: Self-pay | Admitting: Cardiology

## 2021-11-24 ENCOUNTER — Encounter: Payer: Self-pay | Admitting: Podiatry

## 2021-11-24 ENCOUNTER — Ambulatory Visit (INDEPENDENT_AMBULATORY_CARE_PROVIDER_SITE_OTHER): Payer: Medicare Other | Admitting: Podiatry

## 2021-11-24 DIAGNOSIS — F0280 Dementia in other diseases classified elsewhere without behavioral disturbance: Secondary | ICD-10-CM | POA: Insufficient documentation

## 2021-11-24 DIAGNOSIS — M79675 Pain in left toe(s): Secondary | ICD-10-CM

## 2021-11-24 DIAGNOSIS — L84 Corns and callosities: Secondary | ICD-10-CM

## 2021-11-24 DIAGNOSIS — I5042 Chronic combined systolic (congestive) and diastolic (congestive) heart failure: Secondary | ICD-10-CM | POA: Insufficient documentation

## 2021-11-24 DIAGNOSIS — E78019 Familial hypercholesterolemia, unspecified: Secondary | ICD-10-CM | POA: Insufficient documentation

## 2021-11-24 DIAGNOSIS — M79671 Pain in right foot: Secondary | ICD-10-CM

## 2021-11-24 DIAGNOSIS — E7801 Familial hypercholesterolemia: Secondary | ICD-10-CM | POA: Insufficient documentation

## 2021-11-24 DIAGNOSIS — B351 Tinea unguium: Secondary | ICD-10-CM

## 2021-11-24 DIAGNOSIS — M79672 Pain in left foot: Secondary | ICD-10-CM

## 2021-11-24 DIAGNOSIS — R7303 Prediabetes: Secondary | ICD-10-CM

## 2021-11-24 DIAGNOSIS — M25519 Pain in unspecified shoulder: Secondary | ICD-10-CM | POA: Insufficient documentation

## 2021-11-24 DIAGNOSIS — M79674 Pain in right toe(s): Secondary | ICD-10-CM | POA: Diagnosis not present

## 2021-11-24 DIAGNOSIS — J410 Simple chronic bronchitis: Secondary | ICD-10-CM | POA: Insufficient documentation

## 2021-11-24 DIAGNOSIS — E039 Hypothyroidism, unspecified: Secondary | ICD-10-CM | POA: Insufficient documentation

## 2021-11-24 DIAGNOSIS — G8929 Other chronic pain: Secondary | ICD-10-CM | POA: Insufficient documentation

## 2021-11-24 DIAGNOSIS — E559 Vitamin D deficiency, unspecified: Secondary | ICD-10-CM | POA: Insufficient documentation

## 2021-11-24 DIAGNOSIS — F5105 Insomnia due to other mental disorder: Secondary | ICD-10-CM | POA: Insufficient documentation

## 2021-11-29 NOTE — Progress Notes (Signed)
  Subjective:  Patient ID: Alyssa Graves, female    DOB: 23-Aug-1929,  MRN: [id number]  Alyssa Graves presents to clinic today for callus(es) left lower extremity and painful thick toenails that are difficult to trim. Painful toenails interfere with ambulation. Aggravating factors include wearing enclosed shoe gear. Pain is relieved with periodic professional debridement. Painful calluses are aggravated when weightbearing with and without shoegear. Pain is relieved with periodic professional debridement.  Patient has h/o dementia and is accompanied by her daughter, Alyssa Graves, on today's visit. Alyssa Graves resides at home with her family. She is a poor historian.  New problem(s): None.   PCP is [facility]  No Known Allergies  Review of Systems: Negative except as noted in the HPI.  Objective: No changes noted in today's physical examination.  Constitutional Alyssa Graves is a pleasant 88 y.o. African American female, in NAD. AAO x 3.   Vascular CFT <3 seconds b/l LE. Palpable DP pulse(s) b/l LE. Palpable PT pulse(s) b/l LE. Pedal hair sparse. No pain with calf compression b/l. Lower extremity skin temperature gradient within normal limits. Trace edema noted BLE. No ischemia or gangrene noted b/l LE. No cyanosis or clubbing noted b/l LE.  Neurologic Patient unable to follow commands of LE neurological examination due to cognitive deficits. Patient does respond to external noxious stimuli. Clonus negative b/l.  Dermatologic Pedal skin is warm and supple b/l LE. Toenails 2-5 bilaterally elongated, discolored, dystrophic, thickened, and crumbly with subungual debris and tenderness to dorsal palpation. Anonychia noted bilateral great toes. Nailbed(s) epithelialized.  Hyperkeratotic lesion(s) left great toe and left 2nd toe.  No erythema, no edema, no drainage, no fluctuance.  Orthopedic: Muscle strength 5/5 to all lower extremity muscle groups bilaterally. Pes planus  deformity noted bilateral LE.   Assessment/Plan: 1. Pain due to onychomycosis of toenail   2. Corns and callosities   3. Pain in both feet   4. Prediabetes     -Patient was evaluated and treated. All patient's and/or POA's questions/concerns answered on today's visit. -Medicaid ABN signed for this year. Patient consents for services of paring of corns/calluses today. Copy has been placed in patient chart. -Patient to continue soft, supportive shoe gear daily. -Toenails 2-5 bilaterally debrided in length and girth without iatrogenic bleeding with sterile nail nipper and dremel.  -Corn(s) L 2nd toe and callus(es) left great toe were pared utilizing sterile scalpel blade without incident. Total number debrided =2. -Patient/POA to call should there be question/concern in the interim.   Return in about 3 months (around 02/24/2022).  Freddie Breech, DPM

## 2022-03-02 ENCOUNTER — Ambulatory Visit (INDEPENDENT_AMBULATORY_CARE_PROVIDER_SITE_OTHER): Payer: Medicare Other | Admitting: Podiatry

## 2022-03-02 ENCOUNTER — Encounter: Payer: Self-pay | Admitting: Podiatry

## 2022-03-02 DIAGNOSIS — B351 Tinea unguium: Secondary | ICD-10-CM | POA: Diagnosis not present

## 2022-03-02 DIAGNOSIS — L84 Corns and callosities: Secondary | ICD-10-CM | POA: Diagnosis not present

## 2022-03-02 DIAGNOSIS — R7303 Prediabetes: Secondary | ICD-10-CM | POA: Diagnosis not present

## 2022-03-02 DIAGNOSIS — M79676 Pain in unspecified toe(s): Secondary | ICD-10-CM

## 2022-03-06 DIAGNOSIS — R54 Age-related physical debility: Secondary | ICD-10-CM | POA: Insufficient documentation

## 2022-03-06 NOTE — Progress Notes (Signed)
  Subjective:  Patient ID: Alyssa Graves, female    DOB: 04/29/1930,  MRN: [id number]  Alyssa Graves presents to clinic today for callus(es) b/l lower extremities and painful thick toenails that are difficult to trim. Painful toenails interfere with ambulation. Aggravating factors include wearing enclosed shoe gear. Pain is relieved with periodic professional debridement. Painful calluses are aggravated when weightbearing with and without shoegear. Pain is relieved with periodic professional debridement.  Patient is accompanied by her daughter on today's visit. Daughter states Graves is not wanting to wear her enclosed shoes and her left great toe and left 2nd toe appear to be bothering her.  Alyssa Graves is a poor historian.  PCP is [facility], [place] , and last visit was August, 2023.  No Known Allergies  Review of Systems: Negative except as noted in the HPI.  Objective: No changes noted in today's physical examination. Alyssa Graves is a pleasant 88 y.o. female in NAD. AAO x 2.  Constitutional Alyssa Graves is a pleasant 88 y.o. African American female, in NAD. AAO x 3.   Vascular CFT <3 seconds b/l LE. Palpable DP pulse(s) b/l LE. Palpable PT pulse(s) b/l LE. Pedal hair sparse. No pain with calf compression b/l. Lower extremity skin temperature gradient within normal limits. Trace edema noted BLE. No ischemia or gangrene noted b/l LE. No cyanosis or clubbing noted b/l LE.  Neurologic Patient unable to follow commands of LE neurological examination due to cognitive deficits. Patient does respond to external noxious stimuli. Clonus negative b/l.  Dermatologic Pedal skin is warm and supple b/l LE. Toenails 2-5 bilaterally elongated, discolored, dystrophic, thickened, and crumbly with subungual debris and tenderness to dorsal palpation. Anonychia noted bilateral great toes. Nailbed(s) epithelialized.  Hyperkeratotic lesion(s) submet head 3 left foot. Preulcerative lesion  noted distal tip of left great toe, distal tip of left 2nd toe, and distal tip of right 2nd toe. There is visible subdermal hemorrhage. There is no surrounding erythema, no edema, no drainage, no odor, no fluctuance.  Orthopedic: Muscle strength 5/5 to all lower extremity muscle groups bilaterally. Pes planus deformity noted bilateral LE.   Assessment/Plan: 1. Pain due to onychomycosis of toenail   2. Pre-ulcerative calluses   3. Callus   4. Prediabetes     -Patient's family member present. All questions/concerns addressed on today's visit. -Examined patient. -Medicaid ABN on file for paring of corns/calluses. -Patient to continue soft, supportive shoe gear daily. -Mycotic toenails 1-5 bilaterally were debrided in length and girth with sterile nail nippers and dremel without incident. -Callus(es) submet head 3 left foot pared utilizing sterile scalpel blade without complication or incident. Total number debrided =1. -Preulcerative lesion pared distal tip of left great toe, distal tip of left 2nd toe, and distal tip of right 2nd toe utilizing sterile scalpel blade. Total number pared=3. -Patient/POA to call should there be question/concern in the interim.   Return in about 3 months (around 06/01/2022).  Marzetta Board, DPM

## 2022-04-17 ENCOUNTER — Telehealth: Payer: Self-pay | Admitting: Cardiology

## 2022-04-17 NOTE — Telephone Encounter (Signed)
Patient's family wants to know if Dr. Martinique would send in a prescription for the patient to have nitroglycerin. Please call back

## 2022-04-17 NOTE — Telephone Encounter (Signed)
Returned call to Vicente Males, palliative care nurse.  She states family would like to know if Dr. Martinique would order SL Nitroglycerin 0.4mg  PRN for patient to have on hand if needed. She states patient is not having any CP but the family would like to have it available just in case.  Will forward to Dr. Martinique to review and advise.  Please call patient's daughter Verdene Lennert or [place]) to let them know if NTG is ordered and where/when it is sent.

## 2022-04-19 ENCOUNTER — Other Ambulatory Visit: Payer: Self-pay

## 2022-04-19 MED ORDER — NITROGLYCERIN 0.4 MG SL SUBL: 0.4000 mg | SUBLINGUAL_TABLET | SUBLINGUAL | 3 refills | Status: AC | PRN

## 2022-04-19 NOTE — Telephone Encounter (Signed)
Left message for patient's daughter Alyssa Graves to find out where to send prescription.  Spoke with patient's daughter Rudi Heap who states that she is not the one that lives with and takes care of her mother so she is not sure where the prescription should be sent. Will await call back from [place].

## 2022-04-19 NOTE — Telephone Encounter (Signed)
Patient's daughter returned call she said prescription can be sent to [facility], [place].

## 2022-04-19 NOTE — Telephone Encounter (Signed)
RX sent to pharmacy listed below.   Thank you!

## 2022-04-24 ENCOUNTER — Other Ambulatory Visit: Payer: Self-pay | Admitting: Cardiology

## 2022-06-02 ENCOUNTER — Ambulatory Visit (INDEPENDENT_AMBULATORY_CARE_PROVIDER_SITE_OTHER): Payer: Medicare Other | Admitting: Podiatry

## 2022-06-02 DIAGNOSIS — B351 Tinea unguium: Secondary | ICD-10-CM | POA: Diagnosis not present

## 2022-06-02 DIAGNOSIS — L84 Corns and callosities: Secondary | ICD-10-CM | POA: Diagnosis not present

## 2022-06-02 DIAGNOSIS — M79676 Pain in unspecified toe(s): Secondary | ICD-10-CM

## 2022-06-02 NOTE — Progress Notes (Signed)
  Subjective:  Patient ID: Alyssa Graves, female    DOB: 10/21/1929,  MRN: [id number]  Chief Complaint  Patient presents with   Nail Problem    Routine Foot Care    88 y.o. female presents with the above complaint. History confirmed with patient. Patient presenting with pain related to dystrophic thickened elongated nails. Patient is unable to trim own nails related to nail dystrophy and/or mobility issues. Patient does not have a history of T2DM. Patient does have callus present located at the distal tuft of the bilateral hallux and Left 2nd toe causing pain.   Objective:  Physical Exam: warm, good capillary refill nail exam onychomycosis of the toenails, onycholysis, and dystrophic nails DP pulses palpable, PT pulses palpable, and protective sensation intact Left Foot:  Pain with palpation of nails due to elongation and dystrophic growth.  Right Foot: Pain with palpation of nails due to elongation and dystrophic growth.   Assessment:   1. Pain due to onychomycosis of toenail   2. Pre-ulcerative calluses      Plan:  Patient was evaluated and treated and all questions answered.  #Hyperkeratotic lesions/pre ulcerative calluses present distal hallux bilaterally and left 2nd toe All symptomatic hyperkeratoses x 3 separate lesions were safely debrided with a sterile #10 blade to patient's level of comfort without incident. We discussed preventative and palliative care of these lesions including supportive and accommodative shoegear, padding, prefabricated and custom molded accommodative orthoses, use of a pumice stone and lotions/creams daily.  #Onychomycosis with pain  -Nails palliatively debrided as below. -Educated on self-care  Procedure: Nail Debridement Rationale: Pain Type of Debridement: manual, sharp debridement. Instrumentation: Nail nipper, rotary burr. Number of Nails: 10  Return in about 3 months (around 09/01/2022) for RFC.         Corinna Gab, DPM [facility] / [facility]

## 2022-09-18 ENCOUNTER — Ambulatory Visit: Payer: 59 | Admitting: Podiatry

## 2022-09-22 ENCOUNTER — Ambulatory Visit (INDEPENDENT_AMBULATORY_CARE_PROVIDER_SITE_OTHER): Payer: 59 | Admitting: Podiatry

## 2022-09-22 DIAGNOSIS — M79676 Pain in unspecified toe(s): Secondary | ICD-10-CM

## 2022-09-22 DIAGNOSIS — B351 Tinea unguium: Secondary | ICD-10-CM | POA: Diagnosis not present

## 2022-09-22 DIAGNOSIS — L84 Corns and callosities: Secondary | ICD-10-CM | POA: Diagnosis not present

## 2022-09-22 DIAGNOSIS — R7303 Prediabetes: Secondary | ICD-10-CM | POA: Diagnosis not present

## 2022-09-22 NOTE — Progress Notes (Signed)
  Subjective:  Patient ID: Alyssa Graves, female    DOB: 1930/05/22,  MRN: [id number]  Chief Complaint  Patient presents with   routine foot care     89 y.o. female presents with the above complaint. History confirmed with patient. Patient presenting with pain related to dystrophic thickened elongated nails. Patient is unable to trim own nails related to nail dystrophy and/or mobility issues. Patient does not have a history of T2DM. Patient does have callus present located at the distal tuft of the bilateral hallux and Left 2nd toe causing pain.   Objective:  Physical Exam: warm, good capillary refill nail exam onychomycosis of the toenails, onycholysis, and dystrophic nails DP pulses palpable, PT pulses palpable, and protective sensation intact Left Foot:  Pain with palpation of nails due to elongation and dystrophic growth.  Right Foot: Pain with palpation of nails due to elongation and dystrophic growth.   Assessment:   1. Pain due to onychomycosis of toenail   2. Pre-ulcerative calluses   3. Prediabetes       Plan:  Patient was evaluated and treated and all questions answered.  #Hyperkeratotic lesions/pre ulcerative calluses present distal hallux bilaterally and left 2nd toe All symptomatic hyperkeratoses x 3 separate lesions were safely debrided with a sterile #10 blade to patient's level of comfort without incident. We discussed preventative and palliative care of these lesions including supportive and accommodative shoegear, padding, prefabricated and custom molded accommodative orthoses, use of a pumice stone and lotions/creams daily.  #Onychomycosis with pain  -Nails palliatively debrided as below. -Educated on self-care  Procedure: Nail Debridement Rationale: Pain Type of Debridement: manual, sharp debridement. Instrumentation: Nail nipper, rotary burr. Number of Nails: 10  Return in about 3 months (around 12/22/2022) for [facility].         Corinna Gab,  DPM [facility] / [facility]

## 2022-12-25 ENCOUNTER — Ambulatory Visit (INDEPENDENT_AMBULATORY_CARE_PROVIDER_SITE_OTHER): Payer: 59 | Admitting: Podiatry

## 2022-12-25 DIAGNOSIS — M79676 Pain in unspecified toe(s): Secondary | ICD-10-CM | POA: Diagnosis not present

## 2022-12-25 DIAGNOSIS — R7303 Prediabetes: Secondary | ICD-10-CM

## 2022-12-25 DIAGNOSIS — B351 Tinea unguium: Secondary | ICD-10-CM | POA: Diagnosis not present

## 2022-12-25 DIAGNOSIS — L84 Corns and callosities: Secondary | ICD-10-CM

## 2022-12-25 NOTE — Progress Notes (Signed)
  Subjective:  Patient ID: Alyssa Graves, female    DOB: 1929-06-21,  MRN: [id number]  Chief Complaint  Patient presents with   Nail Problem    Routine Foot Care- nail trim     89 y.o. female presents with the above complaint. History confirmed with patient. Patient presenting with pain related to dystrophic thickened elongated nails. Patient is unable to trim own nails related to nail dystrophy and/or mobility issues. Patient does not have a history of T2DM. Patient does have callus present located at the distal tuft of the bilateral hallux and Left 2nd toe causing pain.   Objective:  Physical Exam: warm, good capillary refill nail exam onychomycosis of the toenails, onycholysis, and dystrophic nails DP pulses palpable, PT pulses palpable, and protective sensation intact Left Foot:  Pain with palpation of nails due to elongation and dystrophic growth.  Right Foot: Pain with palpation of nails due to elongation and dystrophic growth.   Assessment:   1. Pre-ulcerative calluses   2. Pain due to onychomycosis of toenail   3. Prediabetes        Plan:  Patient was evaluated and treated and all questions answered.  #Hyperkeratotic lesions/pre ulcerative calluses present distal hallux bilaterally and left 2nd toe All symptomatic hyperkeratoses x 3 separate lesions were safely debrided with a sterile #10 blade to patient's level of comfort without incident. We discussed preventative and palliative care of these lesions including supportive and accommodative shoegear, padding, prefabricated and custom molded accommodative orthoses, use of a pumice stone and lotions/creams daily.  #Onychomycosis with pain  -Nails palliatively debrided as below. -Educated on self-care  Procedure: Nail Debridement Rationale: Pain Type of Debridement: manual, sharp debridement. Instrumentation: Nail nipper, rotary burr. Number of Nails: 10  Return in about 3 months (around 03/27/2023) for RFC.          Corinna Gab, DPM [facility] / [facility]

## 2023-04-03 ENCOUNTER — Ambulatory Visit: Payer: 59 | Admitting: Podiatry

## 2023-04-03 ENCOUNTER — Ambulatory Visit (INDEPENDENT_AMBULATORY_CARE_PROVIDER_SITE_OTHER): Payer: 59 | Admitting: Podiatry

## 2023-04-03 DIAGNOSIS — L84 Corns and callosities: Secondary | ICD-10-CM

## 2023-04-03 DIAGNOSIS — M79676 Pain in unspecified toe(s): Secondary | ICD-10-CM | POA: Diagnosis not present

## 2023-04-03 DIAGNOSIS — R7303 Prediabetes: Secondary | ICD-10-CM | POA: Diagnosis not present

## 2023-04-03 DIAGNOSIS — B351 Tinea unguium: Secondary | ICD-10-CM

## 2023-04-03 NOTE — Progress Notes (Signed)
  Subjective:  Patient ID: Alyssa Graves, female    DOB: [birth year],  MRN: [id number]  Chief Complaint  Patient presents with   Nail Problem    RM3- routine nail care     89 y.o. female presents with the above complaint. History confirmed with patient. Patient presenting with pain related to dystrophic thickened elongated nails. Patient is unable to trim own nails related to nail dystrophy and/or mobility issues. Patient does not have a history of T2DM. Patient does have callus present located at the distal tuft of the bilateral hallux and Left 2nd toe causing pain.   Objective:  Physical Exam: warm, good capillary refill nail exam onychomycosis of the toenails, onycholysis, and dystrophic nails DP pulses palpable, PT pulses palpable, and protective sensation intact Left Foot:  Pain with palpation of nails due to elongation and dystrophic growth.  Right Foot: Pain with palpation of nails due to elongation and dystrophic growth.   Assessment:   1. Pre-ulcerative calluses   2. Pain due to onychomycosis of toenail   3. Prediabetes         Plan:  Patient was evaluated and treated and all questions answered.  #Hyperkeratotic lesions/pre ulcerative calluses present distal hallux bilaterally and left 2nd toe All symptomatic hyperkeratoses x 3 separate lesions were safely debrided with a sterile #10 blade to patient's level of comfort without incident. We discussed preventative and palliative care of these lesions including supportive and accommodative shoegear, padding, prefabricated and custom molded accommodative orthoses, use of a pumice stone and lotions/creams daily.  #Onychomycosis with pain  -Nails palliatively debrided as below. -Educated on self-care  Procedure: Nail Debridement Rationale: Pain Type of Debridement: manual, sharp debridement. Instrumentation: Nail nipper, rotary burr. Number of Nails: 10  Return in about 3 months (around 07/04/2023) for [facility]          Corinna Gab, DPM [facility] / [facility]

## 2023-07-10 ENCOUNTER — Telehealth: Payer: Self-pay

## 2023-07-10 ENCOUNTER — Ambulatory Visit: Payer: 59 | Admitting: Podiatry

## 2023-08-29 ENCOUNTER — Ambulatory Visit: Admitting: Physician Assistant

## 2023-11-18 DEATH — deceased
